# Patient Record
Sex: Male | Born: 1945
Health system: Southern US, Community
[De-identification: ages and names within clinical notes are randomized; demographics above are authoritative.]

## PROBLEM LIST (undated history)

## (undated) DIAGNOSIS — E039 Hypothyroidism, unspecified: Secondary | ICD-10-CM

## (undated) DIAGNOSIS — N400 Enlarged prostate without lower urinary tract symptoms: Secondary | ICD-10-CM

## (undated) DIAGNOSIS — E785 Hyperlipidemia, unspecified: Secondary | ICD-10-CM

## (undated) HISTORY — DX: Hyperlipidemia, unspecified: E78.5

## (undated) HISTORY — DX: Benign prostatic hyperplasia without lower urinary tract symptoms: N40.0

## (undated) HISTORY — PX: FEMUR FRACTURE SURGERY: SHX633

## (undated) HISTORY — DX: Hypothyroidism, unspecified: E03.9

---

## 2004-12-22 ENCOUNTER — Ambulatory Visit (HOSPITAL_COMMUNITY): Admission: RE | Admit: 2004-12-22 | Discharge: 2004-12-22 | Payer: Self-pay | Admitting: Gastroenterology

## 2008-04-12 ENCOUNTER — Inpatient Hospital Stay (HOSPITAL_COMMUNITY): Admission: EM | Admit: 2008-04-12 | Discharge: 2008-04-16 | Payer: Self-pay
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
## 2011-03-25 NOTE — Op Note (Signed)
Jeffrey Suarez, Jeffrey Suarez               ACCOUNT NO.:  [id number]   MEDICAL RECORD NO.:  [id number]          PATIENT TYPE:  AMB   LOCATION:  ENDO                         FACILITY:  [facility]   PHYSICIAN:  Anselmo Rod, M.D.  DATE OF BIRTH:  Apr 29, 1946   DATE OF PROCEDURE:  12/22/2004  DATE OF DISCHARGE:                                 OPERATIVE REPORT   PROCEDURE PERFORMED:  Screening colonoscopy.   ENDOSCOPIST:  Charna Elizabeth, M.D.   INSTRUMENT USED:  Olympus video colonoscope.   INDICATIONS FOR PROCEDURE:  A 65 year old white male undergoing screening  colonoscopy to rule out colonic polyps, masses, etc.   PREPROCEDURE PREPARATION:  Informed consent was procured from the patient.  The patient was fasted for eight hours prior to the procedure and prepped  with a bottle of magnesium citrate and a gallon of GoLYTELY the night prior  to the procedure.  The risks and benefits of the procedure including a 10%  miss rate for colon polyps or cancers was discussed with the patient as  well.   PREPROCEDURE PHYSICAL:  The patient had stable vital signs.  Neck supple.  Chest clear to auscultation.  S1 and S2 regular.  Abdomen soft with normal  bowel sounds.   DESCRIPTION OF PROCEDURE:  The patient was placed in left lateral decubitus  position and sedated with 70 mg of Demerol and 7.5 mg of Versed in slow  incremental doses.  Once the patient was adequately sedated and maintained  on low flow oxygen and continuous cardiac monitoring, the Olympus video  colonoscope was advanced from the rectum to the cecum.  The appendicular  orifice and ileocecal valve were clearly visualized and photographed.  There  was some residual stool in the colon and multiple washes were done.  No  masses, polyps, erosions, ulcerations or diverticula were seen.  Small  internal hemorrhoids were seen on retroflexion in the rectum.  The patient  tolerated the procedure well without complication.   IMPRESSION:  1.  Normal  colonoscopy up to the cecum except for small internal      hemorrhoids.  2.  Some stool in the right side of the colon.  Multiple washes done.   RECOMMENDATIONS:  1.  Continue high fiber diet with liberal fluid intake.  2.  Repeat colonoscopy is recommended in the next five years unless the      patient develops any abnormal symptoms in the interim.  3.  Outpatient followup as need arises in the future.      JNM/MEDQ  D:  12/23/2004  T:  12/23/2004  Job:  [id number]

## 2011-08-04 LAB — BASIC METABOLIC PANEL
BUN: 15
BUN: 18
BUN: 9
CO2: 23
Calcium: 8 — ABNORMAL LOW
Chloride: 104
Chloride: 104
Creatinine, Ser: 1.25
GFR calc Af Amer: >60
GFR calc non Af Amer: 58 — ABNORMAL LOW
GFR calc non Af Amer: 59 — ABNORMAL LOW
GFR calc non Af Amer: 60 — ABNORMAL LOW
Glucose, Bld: 116 — ABNORMAL HIGH
Glucose, Bld: 120 — ABNORMAL HIGH
Glucose, Bld: 146 — ABNORMAL HIGH
Potassium: 3.7
Potassium: 3.9
Potassium: 4.1
Sodium: 132 — ABNORMAL LOW
Sodium: 139

## 2011-08-04 LAB — CBC
HCT: 22.2 — ABNORMAL LOW
HCT: 26.4 — ABNORMAL LOW
HCT: 27 — ABNORMAL LOW
HCT: 27.5 — ABNORMAL LOW
HCT: 37.5 — ABNORMAL LOW
Hemoglobin: 9.2 — ABNORMAL LOW
Hemoglobin: 9.4 — ABNORMAL LOW
MCHC: 35.1
MCV: 91.1
MCV: 91.2
MCV: 91.6
MCV: 91.8
Platelets: 137 — ABNORMAL LOW
Platelets: 138 — ABNORMAL LOW
Platelets: 174
Platelets: 188
RBC: 2.94 — ABNORMAL LOW
RBC: 4.11 — ABNORMAL LOW
RDW: 12.9
RDW: 13.2
RDW: 13.3
WBC: 10.9 — ABNORMAL HIGH
WBC: 8
WBC: 8.7

## 2011-08-04 LAB — TYPE AND SCREEN: ABO/RH(D): A NEG

## 2011-08-04 LAB — POCT I-STAT, CHEM 8
BUN: 25 — ABNORMAL HIGH
Chloride: 107
Creatinine, Ser: 1.5
Glucose, Bld: 134 — ABNORMAL HIGH
Potassium: 3.9

## 2011-08-04 LAB — PREPARE RBC (CROSSMATCH)

## 2014-12-17 DIAGNOSIS — Z85828 Personal history of other malignant neoplasm of skin: Secondary | ICD-10-CM | POA: Diagnosis not present

## 2014-12-17 DIAGNOSIS — Z08 Encounter for follow-up examination after completed treatment for malignant neoplasm: Secondary | ICD-10-CM | POA: Diagnosis not present

## 2014-12-17 DIAGNOSIS — L821 Other seborrheic keratosis: Secondary | ICD-10-CM | POA: Diagnosis not present

## 2015-07-22 DIAGNOSIS — Z85828 Personal history of other malignant neoplasm of skin: Secondary | ICD-10-CM | POA: Diagnosis not present

## 2015-07-22 DIAGNOSIS — Z08 Encounter for follow-up examination after completed treatment for malignant neoplasm: Secondary | ICD-10-CM | POA: Diagnosis not present

## 2015-07-22 DIAGNOSIS — L821 Other seborrheic keratosis: Secondary | ICD-10-CM | POA: Diagnosis not present

## 2015-07-22 DIAGNOSIS — L570 Actinic keratosis: Secondary | ICD-10-CM | POA: Diagnosis not present

## 2015-08-21 DIAGNOSIS — Z23 Encounter for immunization: Secondary | ICD-10-CM | POA: Diagnosis not present

## 2015-09-18 DIAGNOSIS — Z23 Encounter for immunization: Secondary | ICD-10-CM | POA: Diagnosis not present

## 2015-11-16 DIAGNOSIS — E039 Hypothyroidism, unspecified: Secondary | ICD-10-CM | POA: Diagnosis not present

## 2015-11-16 DIAGNOSIS — N401 Enlarged prostate with lower urinary tract symptoms: Secondary | ICD-10-CM | POA: Diagnosis not present

## 2015-11-16 DIAGNOSIS — N138 Other obstructive and reflux uropathy: Secondary | ICD-10-CM | POA: Diagnosis not present

## 2015-11-25 DIAGNOSIS — Z85828 Personal history of other malignant neoplasm of skin: Secondary | ICD-10-CM | POA: Diagnosis not present

## 2015-11-25 DIAGNOSIS — D1801 Hemangioma of skin and subcutaneous tissue: Secondary | ICD-10-CM | POA: Diagnosis not present

## 2015-11-25 DIAGNOSIS — Z08 Encounter for follow-up examination after completed treatment for malignant neoplasm: Secondary | ICD-10-CM | POA: Diagnosis not present

## 2016-05-25 DIAGNOSIS — Z08 Encounter for follow-up examination after completed treatment for malignant neoplasm: Secondary | ICD-10-CM | POA: Diagnosis not present

## 2016-05-25 DIAGNOSIS — B079 Viral wart, unspecified: Secondary | ICD-10-CM | POA: Diagnosis not present

## 2016-05-25 DIAGNOSIS — C44612 Basal cell carcinoma of skin of right upper limb, including shoulder: Secondary | ICD-10-CM | POA: Diagnosis not present

## 2016-05-25 DIAGNOSIS — Z85828 Personal history of other malignant neoplasm of skin: Secondary | ICD-10-CM | POA: Diagnosis not present

## 2016-05-25 DIAGNOSIS — L821 Other seborrheic keratosis: Secondary | ICD-10-CM | POA: Diagnosis not present

## 2016-06-13 DIAGNOSIS — L821 Other seborrheic keratosis: Secondary | ICD-10-CM | POA: Diagnosis not present

## 2016-08-17 DIAGNOSIS — Z23 Encounter for immunization: Secondary | ICD-10-CM | POA: Diagnosis not present

## 2016-09-22 DIAGNOSIS — M129 Arthropathy, unspecified: Secondary | ICD-10-CM | POA: Diagnosis not present

## 2016-09-22 DIAGNOSIS — M79675 Pain in left toe(s): Secondary | ICD-10-CM | POA: Diagnosis not present

## 2016-11-11 DIAGNOSIS — E781 Pure hyperglyceridemia: Secondary | ICD-10-CM | POA: Diagnosis not present

## 2016-11-11 DIAGNOSIS — E039 Hypothyroidism, unspecified: Secondary | ICD-10-CM | POA: Diagnosis not present

## 2016-11-14 DIAGNOSIS — N401 Enlarged prostate with lower urinary tract symptoms: Secondary | ICD-10-CM | POA: Diagnosis not present

## 2016-11-14 DIAGNOSIS — N138 Other obstructive and reflux uropathy: Secondary | ICD-10-CM | POA: Diagnosis not present

## 2016-11-14 DIAGNOSIS — N289 Disorder of kidney and ureter, unspecified: Secondary | ICD-10-CM | POA: Diagnosis not present

## 2016-11-14 DIAGNOSIS — R351 Nocturia: Secondary | ICD-10-CM | POA: Diagnosis not present

## 2016-11-14 DIAGNOSIS — E039 Hypothyroidism, unspecified: Secondary | ICD-10-CM | POA: Diagnosis not present

## 2016-11-28 DIAGNOSIS — Z85828 Personal history of other malignant neoplasm of skin: Secondary | ICD-10-CM | POA: Diagnosis not present

## 2016-11-28 DIAGNOSIS — L821 Other seborrheic keratosis: Secondary | ICD-10-CM | POA: Diagnosis not present

## 2016-11-28 DIAGNOSIS — Z08 Encounter for follow-up examination after completed treatment for malignant neoplasm: Secondary | ICD-10-CM | POA: Diagnosis not present

## 2017-02-15 DIAGNOSIS — R351 Nocturia: Secondary | ICD-10-CM | POA: Diagnosis not present

## 2017-02-15 DIAGNOSIS — E039 Hypothyroidism, unspecified: Secondary | ICD-10-CM | POA: Diagnosis not present

## 2017-02-15 DIAGNOSIS — Z87448 Personal history of other diseases of urinary system: Secondary | ICD-10-CM | POA: Diagnosis not present

## 2017-03-20 DIAGNOSIS — H6592 Unspecified nonsuppurative otitis media, left ear: Secondary | ICD-10-CM | POA: Diagnosis not present

## 2017-03-20 DIAGNOSIS — H6123 Impacted cerumen, bilateral: Secondary | ICD-10-CM | POA: Diagnosis not present

## 2017-05-17 DIAGNOSIS — E039 Hypothyroidism, unspecified: Secondary | ICD-10-CM | POA: Diagnosis not present

## 2017-05-17 DIAGNOSIS — Z125 Encounter for screening for malignant neoplasm of prostate: Secondary | ICD-10-CM | POA: Diagnosis not present

## 2017-05-17 DIAGNOSIS — R351 Nocturia: Secondary | ICD-10-CM | POA: Diagnosis not present

## 2017-05-17 DIAGNOSIS — Z87448 Personal history of other diseases of urinary system: Secondary | ICD-10-CM | POA: Diagnosis not present

## 2017-05-17 DIAGNOSIS — N401 Enlarged prostate with lower urinary tract symptoms: Secondary | ICD-10-CM | POA: Diagnosis not present

## 2017-05-17 DIAGNOSIS — E782 Mixed hyperlipidemia: Secondary | ICD-10-CM | POA: Diagnosis not present

## 2017-05-17 DIAGNOSIS — Z0000 Encounter for general adult medical examination without abnormal findings: Secondary | ICD-10-CM | POA: Diagnosis not present

## 2017-07-12 DIAGNOSIS — L821 Other seborrheic keratosis: Secondary | ICD-10-CM | POA: Diagnosis not present

## 2017-07-12 DIAGNOSIS — Z08 Encounter for follow-up examination after completed treatment for malignant neoplasm: Secondary | ICD-10-CM | POA: Diagnosis not present

## 2017-07-12 DIAGNOSIS — L570 Actinic keratosis: Secondary | ICD-10-CM | POA: Diagnosis not present

## 2017-07-12 DIAGNOSIS — Z85828 Personal history of other malignant neoplasm of skin: Secondary | ICD-10-CM | POA: Diagnosis not present

## 2017-08-21 DIAGNOSIS — Z23 Encounter for immunization: Secondary | ICD-10-CM | POA: Diagnosis not present

## 2018-01-02 DIAGNOSIS — R6889 Other general symptoms and signs: Secondary | ICD-10-CM | POA: Diagnosis not present

## 2018-01-29 DIAGNOSIS — M6740 Ganglion, unspecified site: Secondary | ICD-10-CM | POA: Diagnosis not present

## 2018-01-29 DIAGNOSIS — H1033 Unspecified acute conjunctivitis, bilateral: Secondary | ICD-10-CM | POA: Diagnosis not present

## 2018-01-29 DIAGNOSIS — E782 Mixed hyperlipidemia: Secondary | ICD-10-CM | POA: Diagnosis not present

## 2018-03-02 DIAGNOSIS — M13841 Other specified arthritis, right hand: Secondary | ICD-10-CM | POA: Diagnosis not present

## 2018-03-02 DIAGNOSIS — M13842 Other specified arthritis, left hand: Secondary | ICD-10-CM | POA: Diagnosis not present

## 2018-04-16 ENCOUNTER — Other Ambulatory Visit: Payer: Self-pay | Admitting: Family Medicine

## 2018-04-16 ENCOUNTER — Ambulatory Visit (HOSPITAL_BASED_OUTPATIENT_CLINIC_OR_DEPARTMENT_OTHER)
Admission: RE | Admit: 2018-04-16 | Discharge: 2018-04-16 | Disposition: A | Discharge status: Home / Self Care | Payer: Medicare Other | Source: Ambulatory Visit | Attending: Family Medicine | Admitting: Family Medicine

## 2018-04-16 DIAGNOSIS — M79662 Pain in left lower leg: Secondary | ICD-10-CM | POA: Diagnosis not present

## 2018-04-16 DIAGNOSIS — M7989 Other specified soft tissue disorders: Secondary | ICD-10-CM | POA: Insufficient documentation

## 2018-04-16 DIAGNOSIS — R0989 Other specified symptoms and signs involving the circulatory and respiratory systems: Secondary | ICD-10-CM | POA: Diagnosis not present

## 2018-04-16 DIAGNOSIS — J22 Unspecified acute lower respiratory infection: Secondary | ICD-10-CM | POA: Diagnosis not present

## 2018-04-16 DIAGNOSIS — M79605 Pain in left leg: Secondary | ICD-10-CM | POA: Diagnosis not present

## 2018-04-16 DIAGNOSIS — R0602 Shortness of breath: Secondary | ICD-10-CM | POA: Diagnosis not present

## 2018-04-16 DIAGNOSIS — R05 Cough: Secondary | ICD-10-CM | POA: Diagnosis not present

## 2018-04-23 DIAGNOSIS — L570 Actinic keratosis: Secondary | ICD-10-CM | POA: Diagnosis not present

## 2018-04-23 DIAGNOSIS — Z85828 Personal history of other malignant neoplasm of skin: Secondary | ICD-10-CM | POA: Diagnosis not present

## 2018-04-23 DIAGNOSIS — L821 Other seborrheic keratosis: Secondary | ICD-10-CM | POA: Diagnosis not present

## 2018-06-25 DIAGNOSIS — Z1211 Encounter for screening for malignant neoplasm of colon: Secondary | ICD-10-CM | POA: Diagnosis not present

## 2018-06-25 DIAGNOSIS — N401 Enlarged prostate with lower urinary tract symptoms: Secondary | ICD-10-CM | POA: Diagnosis not present

## 2018-06-25 DIAGNOSIS — E039 Hypothyroidism, unspecified: Secondary | ICD-10-CM | POA: Diagnosis not present

## 2018-06-25 DIAGNOSIS — Z0000 Encounter for general adult medical examination without abnormal findings: Secondary | ICD-10-CM | POA: Diagnosis not present

## 2018-06-25 DIAGNOSIS — H6122 Impacted cerumen, left ear: Secondary | ICD-10-CM | POA: Diagnosis not present

## 2018-06-25 DIAGNOSIS — E782 Mixed hyperlipidemia: Secondary | ICD-10-CM | POA: Diagnosis not present

## 2018-07-02 DIAGNOSIS — Z0000 Encounter for general adult medical examination without abnormal findings: Secondary | ICD-10-CM | POA: Diagnosis not present

## 2018-07-02 DIAGNOSIS — E782 Mixed hyperlipidemia: Secondary | ICD-10-CM | POA: Diagnosis not present

## 2018-07-02 DIAGNOSIS — E039 Hypothyroidism, unspecified: Secondary | ICD-10-CM | POA: Diagnosis not present

## 2018-07-13 DIAGNOSIS — R0609 Other forms of dyspnea: Secondary | ICD-10-CM | POA: Diagnosis not present

## 2018-07-13 DIAGNOSIS — E782 Mixed hyperlipidemia: Secondary | ICD-10-CM | POA: Diagnosis not present

## 2018-07-13 DIAGNOSIS — R6889 Other general symptoms and signs: Secondary | ICD-10-CM | POA: Diagnosis not present

## 2018-07-27 DIAGNOSIS — R6889 Other general symptoms and signs: Secondary | ICD-10-CM | POA: Diagnosis not present

## 2018-07-27 DIAGNOSIS — R0602 Shortness of breath: Secondary | ICD-10-CM | POA: Diagnosis not present

## 2018-08-15 DIAGNOSIS — R0609 Other forms of dyspnea: Secondary | ICD-10-CM | POA: Diagnosis not present

## 2018-08-27 DIAGNOSIS — R0609 Other forms of dyspnea: Secondary | ICD-10-CM | POA: Diagnosis not present

## 2018-08-27 DIAGNOSIS — R6889 Other general symptoms and signs: Secondary | ICD-10-CM | POA: Diagnosis not present

## 2018-08-27 DIAGNOSIS — E782 Mixed hyperlipidemia: Secondary | ICD-10-CM | POA: Diagnosis not present

## 2018-08-28 DIAGNOSIS — Z23 Encounter for immunization: Secondary | ICD-10-CM | POA: Diagnosis not present

## 2018-09-28 ENCOUNTER — Ambulatory Visit (INDEPENDENT_AMBULATORY_CARE_PROVIDER_SITE_OTHER): Payer: Medicare Other | Admitting: Pulmonary Disease

## 2018-09-28 ENCOUNTER — Encounter: Payer: Self-pay | Admitting: Pulmonary Disease

## 2018-09-28 VITALS — BP 118/80 | HR 92 | Ht 67.5 in | Wt 169.6 lb

## 2018-09-28 DIAGNOSIS — I340 Nonrheumatic mitral (valve) insufficiency: Secondary | ICD-10-CM | POA: Diagnosis not present

## 2018-09-28 DIAGNOSIS — R0609 Other forms of dyspnea: Secondary | ICD-10-CM | POA: Diagnosis not present

## 2018-09-28 DIAGNOSIS — I272 Pulmonary hypertension, unspecified: Secondary | ICD-10-CM | POA: Diagnosis not present

## 2018-09-28 DIAGNOSIS — I071 Rheumatic tricuspid insufficiency: Secondary | ICD-10-CM | POA: Diagnosis not present

## 2018-09-28 NOTE — Patient Instructions (Addendum)
Thank you for visiting Dr. Valeta Harms at [facility]. Today we recommend the following: Orders Placed This Encounter  Procedures  . Pulmonary function test   Please schedule PFTs next week. I we will call with results.   Return in about 8 weeks (around 11/23/2018).

## 2018-09-28 NOTE — Progress Notes (Signed)
Synopsis: Referred in November 2019 for SOB/DOE by Glenford Bayley, DO  Subjective:   PATIENT ID: Jeffrey Suarez GENDER: male DOB: 1946/06/25, MRN: [id number]  Chief Complaint  Patient presents with  . Consult    States he does alot of cycling with age group 13-71, over the summer it got really hot and they continued to ride, states he overheats and looses his breath. He siad it sometimes takes him 2 days to recover. Denies chest pain and chest tightness. Has seen cardiologist.    PMH of BPH, hypothyroidism. Over the past several months he has noticed increasing DOE on exertion. He has noticed worsening with significant heat exposure. This SOB has been present for many years. He noticed this even as a child and growing up exercising and play sports. He does feel like he looses a lot of fluid while riding his bike. He is currently an avid biker. Riding his bicycle up to 40+ miles at a time. He rides up to two times per week.   The patient was seen by [facility] cardiology.  He had a stress test which was read as an intermediate risk study due to a low LVEF calculated at 38% on the gated SPECT imaging.  Echocardiogram however completed following this on 08/15/2018 was read as a normal size, mild concentric hypertrophy and a calculated EF 62%.  He does have mild MR and TR.  His estimated pulmonary systolic pressure was 32.  He did have a dilated IVC on echo with elevated RA pressures.  He does not have any chest tightness or wheezing with exertion.  To his knowledge he has never had any wheezing respiratory symptoms before.  He does not have cough.  He tries to maintain hydrated during his exercise and cycling.  If it is extremely hot day meaning above 73 F he refuses to ride on these days because of worsening symptomatology.   Past Medical History:  Diagnosis Date  . BPH (benign prostatic hyperplasia)   . HLD (hyperlipidemia)   . Hypothyroidism      Family History  Problem Relation Age of  Onset  . Lung cancer Mother   . Breast cancer Mother   . Heart disease Father      Past Surgical History:  Procedure Laterality Date  . FEMUR FRACTURE SURGERY      Social History   Socioeconomic History  . Marital status: Married    Spouse name: Not on file  . Number of children: Not on file  . Years of education: Not on file  . Highest education level: Not on file  Occupational History  . Not on file  Social Needs  . Financial resource strain: Not on file  . Food insecurity:    Worry: Not on file    Inability: Not on file  . Transportation needs:    Medical: Not on file    Non-medical: Not on file  Tobacco Use  . Smoking status: Never Smoker  . Smokeless tobacco: Never Used  Substance and Sexual Activity  . Alcohol use: Yes    Comment: occasionally   . Drug use: Never  . Sexual activity: Not on file  Lifestyle  . Physical activity:    Days per week: Not on file    Minutes per session: Not on file  . Stress: Not on file  Relationships  . Social connections:    Talks on phone: Not on file    Gets together: Not on file  Attends religious service: Not on file    Active member of club or organization: Not on file    Attends meetings of clubs or organizations: Not on file    Relationship status: Not on file  . Intimate partner violence:    Fear of current or ex partner: Not on file    Emotionally abused: Not on file    Physically abused: Not on file    Forced sexual activity: Not on file  Other Topics Concern  . Not on file  Social History Narrative  . Not on file     Not on File   Outpatient Medications Prior to Visit  Medication Sig Dispense Refill  . levothyroxine (SYNTHROID, LEVOTHROID) 137 MCG tablet Take 137 mcg by mouth daily before breakfast.    . rosuvastatin (CRESTOR) 5 MG tablet Take 5 mg by mouth daily.     No facility-administered medications prior to visit.     Review of Systems  Constitutional: Negative for chills, fever,  malaise/fatigue and weight loss.  HENT: Negative for hearing loss, sore throat and tinnitus.   Eyes: Negative for blurred vision and double vision.  Respiratory: Positive for shortness of breath. Negative for cough, hemoptysis, sputum production, wheezing and stridor.        Only with significant exertion  Cardiovascular: Negative for chest pain, palpitations, orthopnea, leg swelling and PND.  Gastrointestinal: Negative for abdominal pain, constipation, diarrhea, heartburn, nausea and vomiting.  Genitourinary: Negative for dysuria, hematuria and urgency.  Musculoskeletal: Negative for joint pain and myalgias.  Skin: Negative for itching and rash.  Neurological: Negative for dizziness, tingling, weakness and headaches.  Endo/Heme/Allergies: Negative for environmental allergies. Does not bruise/bleed easily.  Psychiatric/Behavioral: Negative for depression. The patient is not nervous/anxious and does not have insomnia.   All other systems reviewed and are negative.    Objective:  Physical Exam  Constitutional: He is oriented to person, place, and time. He appears well-developed and well-nourished. No distress.  HENT:  Head: Normocephalic and atraumatic.  Mouth/Throat: Oropharynx is clear and moist.  Eyes: Pupils are equal, round, and reactive to light. Conjunctivae are normal. No scleral icterus.  Neck: Neck supple. No JVD present. No tracheal deviation present.  Cardiovascular: Normal rate, regular rhythm and intact distal pulses.  Murmur (Faint systolic in the left lateral.) heard. Normal S1-S2  Pulmonary/Chest: Effort normal and breath sounds normal. No accessory muscle usage or stridor. No tachypnea. No respiratory distress. He has no wheezes. He has no rhonchi. He has no rales.  Abdominal: Soft. Bowel sounds are normal. He exhibits no distension. There is no tenderness.  Musculoskeletal: He exhibits no edema or tenderness.  Lymphadenopathy:    He has no cervical adenopathy.    Neurological: He is alert and oriented to person, place, and time.  Skin: Skin is warm and dry. Capillary refill takes less than 2 seconds. No rash noted.  Psychiatric: He has a normal mood and affect. His behavior is normal.  Vitals reviewed.    Vitals:   09/28/18 1030  BP: 118/80  Pulse: 92  SpO2: 99%  Weight: 169 lb 9.6 oz (76.9 kg)  Height: 5' 7.5" (1.715 m)   99% on RA BMI Readings from Last 3 Encounters:  09/28/18 26.17 kg/m   Wt Readings from Last 3 Encounters:  09/28/18 169 lb 9.6 oz (76.9 kg)     CBC    Component Value Date/Time   WBC 8.7 04/16/2008 0530   RBC 2.94 (L) 04/16/2008 0530   HGB 9.4 (  L) 04/16/2008 0530   HCT 27.0 (L) 04/16/2008 0530   PLT 188 04/16/2008 0530   MCV 91.6 04/16/2008 0530   MCHC 34.7 04/16/2008 0530   RDW 13.4 04/16/2008 0530    Chest Imaging: Patient states that he had chest imaging completed back in the summer when he was diagnosed with bronchitis at [facility].  I am not able to review this imaging at this time.  Pulmonary Functions Testing Results: None  FeNO: None   Pathology: None   Echocardiogram: 08/15/2018: None left ventricular cavity is normal.  Mild concentric hypertrophy of the left ventricle.  Normal global wall motion.  Calculated EF 62%.  Trace aortic regurg.  Mild to moderate mitral regurgitation.  Mild tricuspid regurgitation.  Borderline pulmonary hypertension with approximate PA systolic pressure of 32 mmHg.  IVC dilated with a respiratory response of less than 50% consistent with an elevated RA pressure.  Heart Catheterization: None    Assessment & Plan:   DOE (dyspnea on exertion) - Plan: Pulmonary function test  Possible Pulmonary hypertension, elevated pressures on echocardiography  - TTE: 08/15/2018  Moderate mitral regurgitation by prior echocardiogram  Mild tricuspid regurgitation by prior echocardiogram  Discussion:  This is a 72 year old gentleman, BMI 26, currently avid  cyclist approximately 2 trips greater than 40+ miles twice weekly.  During significant heat in summer months develops worsening dyspnea on exertion and unable to maintain his usual pace and rhythm while riding.  He has had cardiac evaluation with a normal nuclear stress test.  There was discussion about completing a CTA to evaluate his coronaries.  Cardiology recommended pulmonary evaluation.  He is a lifelong non-smoker.  No prior family history of lung diseases.  No history of rheumatologic disease.  He does have significant sweat and volume loss during these exercising events.  When he describes his event closely he also complains of an altered sensorium and mental status during the time where he feels a little bit confused or blurred.  With evidence on his echocardiogram of possible pulmonary hypertension and an elevated RA pressure one would consider a worsening of pH with exercise-induced pulmonary hypertension while riding his bicycle and symptoms could become worse with volume depletion.  However I think this would be difficult to prove.  Also he does not have baseline symptoms of exertion or edema consistent with PH.  I think the next best step would be full PFTs with evaluation of lung function, spirometry, lung volumes and DLCO.  If the DLCO is low lung volumes would be abnormal would proceed with chest imaging.  If this was normal would consider right heart catheterization for confirmation of pulmonary hypertension  Greater than 50% of this patient's 60-minute office visit was spent face-to-face discussing the above diagnostic approach.   Current Outpatient Medications:  .  levothyroxine (SYNTHROID, LEVOTHROID) 137 MCG tablet, Take 137 mcg by mouth daily before breakfast., Disp: , Rfl:  .  rosuvastatin (CRESTOR) 5 MG tablet, Take 5 mg by mouth daily., Disp: , Rfl:    Garner Nash, DO [place] [facility] 09/28/2018 11:17 AM

## 2018-10-03 ENCOUNTER — Ambulatory Visit (INDEPENDENT_AMBULATORY_CARE_PROVIDER_SITE_OTHER): Payer: Medicare Other | Admitting: Pulmonary Disease

## 2018-10-03 DIAGNOSIS — R0609 Other forms of dyspnea: Secondary | ICD-10-CM

## 2018-10-03 LAB — PULMONARY FUNCTION TEST
DL/VA % PRED: 69 %
DL/VA: 3.15 ml/min/mmHg/L
DLCO unc % pred: 66 %
DLCO unc: 20.74 ml/min/mmHg
FEF 25-75 POST: 2.64 L/s
FEF 25-75 Pre: 1.75 L/sec
FEF2575-%CHANGE-POST: 51 %
FEF2575-%PRED-PRE: 74 %
FEF2575-%Pred-Post: 113 %
FEV1-%CHANGE-POST: 12 %
FEV1-%PRED-PRE: 94 %
FEV1-%Pred-Post: 106 %
FEV1-PRE: 2.93 L
FEV1-Post: 3.28 L
FEV1FVC-%Change-Post: 6 %
FEV1FVC-%PRED-PRE: 92 %
FEV6-%Change-Post: 5 %
FEV6-%PRED-PRE: 106 %
FEV6-%Pred-Post: 112 %
FEV6-POST: 4.48 L
FEV6-Pre: 4.23 L
FEV6FVC-%Change-Post: 0 %
FEV6FVC-%PRED-POST: 104 %
FEV6FVC-%Pred-Pre: 104 %
FVC-%CHANGE-POST: 5 %
FVC-%PRED-POST: 107 %
FVC-%PRED-PRE: 102 %
FVC-POST: 4.55 L
FVC-PRE: 4.31 L
PRE FEV1/FVC RATIO: 68 %
Post FEV1/FVC ratio: 72 %
Post FEV6/FVC ratio: 99 %
Pre FEV6/FVC Ratio: 98 %
RV % pred: 45 %
RV: 1.1 L
TLC % PRED: 77 %
TLC: 5.31 L

## 2018-10-03 NOTE — Progress Notes (Signed)
PFT done today. 

## 2018-10-19 ENCOUNTER — Telehealth: Payer: Self-pay | Admitting: Pulmonary Disease

## 2018-10-19 DIAGNOSIS — R0602 Shortness of breath: Secondary | ICD-10-CM

## 2018-10-19 DIAGNOSIS — R942 Abnormal results of pulmonary function studies: Secondary | ICD-10-CM

## 2018-10-19 NOTE — Telephone Encounter (Signed)
Dr. Valeta Harms, please advise, patient is requesting the results from his PFT test. Thank you.

## 2018-10-22 NOTE — Telephone Encounter (Signed)
PCCM:  He has a mildly reduced DLCO. As discussed in the office last time he was here pending any abnormalities of his PFTs would consider HRCT imaging of the chest. He has not had a CXR recently in our system and his insurance my require CXR prior to HRCT. However, we can discuss this at his next visit. But if patient is willing would like to proceed with HRCT imaging of the chest. (dx DOE, decreased diffusion capacity)  Thanks  Garner Nash, DO [place] [facility] 10/22/2018 8:47 AM

## 2018-10-22 NOTE — Telephone Encounter (Signed)
Called and spoke with pt letting him know the results of the PFT and based on there being some mild decrease in diffusion capacity, stated to pt that BI would like to go ahead and proceed with HRCT if he was fine with it.  Pt stated that he would proceed with the HRCT but wanted to have it done right before his f/u with BI. I have placed order for the CT. Nothing further needed.

## 2018-11-14 ENCOUNTER — Ambulatory Visit (HOSPITAL_BASED_OUTPATIENT_CLINIC_OR_DEPARTMENT_OTHER)
Admission: RE | Admit: 2018-11-14 | Discharge: 2018-11-14 | Disposition: A | Discharge status: Home / Self Care | Payer: Medicare Other | Source: Ambulatory Visit | Attending: Pulmonary Disease | Admitting: Pulmonary Disease

## 2018-11-14 DIAGNOSIS — R0602 Shortness of breath: Secondary | ICD-10-CM | POA: Diagnosis not present

## 2018-11-14 DIAGNOSIS — R942 Abnormal results of pulmonary function studies: Secondary | ICD-10-CM | POA: Insufficient documentation

## 2018-11-14 DIAGNOSIS — R918 Other nonspecific abnormal finding of lung field: Secondary | ICD-10-CM | POA: Diagnosis not present

## 2018-11-23 ENCOUNTER — Ambulatory Visit (INDEPENDENT_AMBULATORY_CARE_PROVIDER_SITE_OTHER): Payer: Medicare Other | Admitting: Pulmonary Disease

## 2018-11-23 ENCOUNTER — Encounter: Payer: Self-pay | Admitting: Pulmonary Disease

## 2018-11-23 VITALS — BP 106/68 | HR 82 | Ht 67.0 in | Wt 170.0 lb

## 2018-11-23 DIAGNOSIS — R942 Abnormal results of pulmonary function studies: Secondary | ICD-10-CM | POA: Diagnosis not present

## 2018-11-23 DIAGNOSIS — R918 Other nonspecific abnormal finding of lung field: Secondary | ICD-10-CM

## 2018-11-23 DIAGNOSIS — R911 Solitary pulmonary nodule: Secondary | ICD-10-CM

## 2018-11-23 DIAGNOSIS — R0602 Shortness of breath: Secondary | ICD-10-CM | POA: Diagnosis not present

## 2018-11-23 NOTE — Progress Notes (Signed)
Synopsis: Referred in November 2019 for SOB/DOE by No ref. provider found  Subjective:   PATIENT ID: Jeffrey Suarez GENDER: male DOB: 06-23-1946, MRN: [id number]  Chief Complaint  Patient presents with  . Follow-up    F/U for DOE. Chest CT 01/08.     PMH of BPH, hypothyroidism. Over the past several months he has noticed increasing DOE on exertion. He has noticed worsening with significant heat exposure. This SOB has been present for many years. He noticed this even as a child and growing up exercising and play sports. He does feel like he looses a lot of fluid while riding his bike. He is currently an avid biker. Riding his bicycle up to 40+ miles at a time. He rides up to two times per week.   The patient was seen by [facility] cardiology.  He had a stress test which was read as an intermediate risk study due to a low LVEF calculated at 38% on the gated SPECT imaging.  Echocardiogram however completed following this on 08/15/2018 was read as a normal size, mild concentric hypertrophy and a calculated EF 62%.  He does have mild MR and TR.  His estimated pulmonary systolic pressure was 32.  He did have a dilated IVC on echo with elevated RA pressures.  He does not have any chest tightness or wheezing with exertion.  To his knowledge he has never had any wheezing respiratory symptoms before.  He does not have cough.  He tries to maintain hydrated during his exercise and cycling.  If it is extremely hot day meaning above 47 F he refuses to ride on these days because of worsening symptomatology.  OV 11/23/2018: Presents today for follow-up regarding high-resolution CT scan.  Overall his symptoms are may be slightly better since he was last seen by [place].  He still has been writing regularly.  He rides his bike approximately 120 miles a week.  He recently completed a very long distance bike ride several weeks ago at 100 miles.  He does well when he is on flat surfaces but still having some shortness  of breath when climbing hills and falls back behind his partners.  He had pulmonary function test which revealed normal spirometry and a mildly reduced DLCO of 66%.  Therefore we ended up having a high-resolution CT scan to rule out any evidence of ILD.  There was no evidence of ILD.  He did have a 3 mm lung nodule.  We reviewed his images today in the office.   Past Medical History:  Diagnosis Date  . BPH (benign prostatic hyperplasia)   . HLD (hyperlipidemia)   . Hypothyroidism      Family History  Problem Relation Age of Onset  . Lung cancer Mother   . Breast cancer Mother   . Heart disease Father      Past Surgical History:  Procedure Laterality Date  . FEMUR FRACTURE SURGERY      Social History   Socioeconomic History  . Marital status: Married    Spouse name: Not on file  . Number of children: Not on file  . Years of education: Not on file  . Highest education level: Not on file  Occupational History  . Not on file  Social Needs  . Financial resource strain: Not on file  . Food insecurity:    Worry: Not on file    Inability: Not on file  . Transportation needs:    Medical: Not on file  Non-medical: Not on file  Tobacco Use  . Smoking status: Never Smoker  . Smokeless tobacco: Never Used  Substance and Sexual Activity  . Alcohol use: Yes    Comment: occasionally   . Drug use: Never  . Sexual activity: Not on file  Lifestyle  . Physical activity:    Days per week: Not on file    Minutes per session: Not on file  . Stress: Not on file  Relationships  . Social connections:    Talks on phone: Not on file    Gets together: Not on file    Attends religious service: Not on file    Active member of club or organization: Not on file    Attends meetings of clubs or organizations: Not on file    Relationship status: Not on file  . Intimate partner violence:    Fear of current or ex partner: Not on file    Emotionally abused: Not on file    Physically  abused: Not on file    Forced sexual activity: Not on file  Other Topics Concern  . Not on file  Social History Narrative  . Not on file     Not on File   Outpatient Medications Prior to Visit  Medication Sig Dispense Refill  . levothyroxine (SYNTHROID, LEVOTHROID) 137 MCG tablet Take 137 mcg by mouth daily before breakfast.    . rosuvastatin (CRESTOR) 5 MG tablet Take 5 mg by mouth daily.     No facility-administered medications prior to visit.     Review of Systems  Constitutional: Negative for chills, fever, malaise/fatigue and weight loss.  HENT: Negative for hearing loss, sore throat and tinnitus.   Eyes: Negative for blurred vision and double vision.  Respiratory: Negative for cough, hemoptysis, sputum production, shortness of breath, wheezing and stridor.   Cardiovascular: Negative for chest pain, palpitations, orthopnea, leg swelling and PND.  Gastrointestinal: Negative for abdominal pain, constipation, diarrhea, heartburn, nausea and vomiting.  Genitourinary: Negative for dysuria, hematuria and urgency.  Musculoskeletal: Negative for joint pain and myalgias.  Skin: Negative for itching and rash.  Neurological: Negative for dizziness, tingling, weakness and headaches.  Endo/Heme/Allergies: Negative for environmental allergies. Does not bruise/bleed easily.  Psychiatric/Behavioral: Negative for depression. The patient is not nervous/anxious and does not have insomnia.   All other systems reviewed and are negative.    Objective:  Physical Exam Vitals signs reviewed.  Constitutional:      General: He is not in acute distress.    Appearance: He is well-developed.  HENT:     Head: Normocephalic and atraumatic.  Eyes:     General: No scleral icterus.    Conjunctiva/sclera: Conjunctivae normal.     Pupils: Pupils are equal, round, and reactive to light.  Neck:     Musculoskeletal: Neck supple.     Vascular: No JVD.     Trachea: No tracheal deviation.    Cardiovascular:     Rate and Rhythm: Normal rate and regular rhythm.     Heart sounds: Murmur present.  Pulmonary:     Effort: Pulmonary effort is normal. No tachypnea, accessory muscle usage or respiratory distress.     Breath sounds: Normal breath sounds. No stridor. No wheezing, rhonchi or rales.  Abdominal:     General: Bowel sounds are normal. There is no distension.     Palpations: Abdomen is soft.     Tenderness: There is no abdominal tenderness.  Musculoskeletal:        General:  No tenderness.  Lymphadenopathy:     Cervical: No cervical adenopathy.  Skin:    General: Skin is warm and dry.     Capillary Refill: Capillary refill takes less than 2 seconds.     Findings: No rash.  Neurological:     Mental Status: He is alert and oriented to person, place, and time.  Psychiatric:        Behavior: Behavior normal.      Vitals:   11/23/18 0908  BP: 106/68  Pulse: 82  SpO2: 100%  Weight: 170 lb (77.1 kg)  Height: 5\' 7"  (1.702 m)   100% on RA BMI Readings from Last 3 Encounters:  11/23/18 26.63 kg/m  09/28/18 26.17 kg/m   Wt Readings from Last 3 Encounters:  11/23/18 170 lb (77.1 kg)  09/28/18 169 lb 9.6 oz (76.9 kg)     CBC    Component Value Date/Time   WBC 8.7 04/16/2008 0530   RBC 2.94 (L) 04/16/2008 0530   HGB 9.4 (L) 04/16/2008 0530   HCT 27.0 (L) 04/16/2008 0530   PLT 188 04/16/2008 0530   MCV 91.6 04/16/2008 0530   MCHC 34.7 04/16/2008 0530   RDW 13.4 04/16/2008 0530    Chest Imaging: Patient states that he had chest imaging completed back in the summer when he was diagnosed with bronchitis at [facility] family [facility].  I am not able to review this imaging at this time.  11/14/2018: HRCT No evidence of ILD.  Small 3 mm lung nodule upper lobe right, The patient's images have been independently reviewed by me.    Pulmonary Functions Testing Results: None PFT Results Latest Ref Rng & Units 10/03/2018  FVC-Pre L 4.31  FVC-Predicted Pre % 102   FVC-Post L 4.55  FVC-Predicted Post % 107  Pre FEV1/FVC % % 68  Post FEV1/FCV % % 72  FEV1-Pre L 2.93  FEV1-Predicted Pre % 94  FEV1-Post L 3.28  DLCO UNC% % 66  DLCO COR %Predicted % 69  TLC L 5.31  TLC % Predicted % 77  RV % Predicted % 45    FeNO: None   Pathology: None   Echocardiogram: 08/15/2018: None left ventricular cavity is normal.  Mild concentric hypertrophy of the left ventricle.  Normal global wall motion.  Calculated EF 62%.  Trace aortic regurg.  Mild to moderate mitral regurgitation.  Mild tricuspid regurgitation.  Borderline pulmonary hypertension with approximate PA systolic pressure of 32 mmHg.  IVC dilated with a respiratory response of less than 50% consistent with an elevated RA pressure.  Heart Catheterization: None    Assessment & Plan:   Shortness of breath  Abnormal findings on diagnostic imaging of lung - Plan: CT Chest Wo Contrast  Lung nodule  Decreased diffusion capacity  Discussion:  73 year old gentleman, BMI 26 avid cyclist approximately 120 miles per week.  He has had a cardiac evaluation with normal nuclear stress imaging and CTA to evaluate his coronaries.  Cardiology referred for pulmonary evaluation.  Pulmonary function test have revealed normal spirometry with a mildly reduced DLCO of 66%.  HRCT of the chest with no evidence of ILD.  He did have a small upper lobe pulmonary nodule.  Today in the office we reviewed each of his images as well as his pulmonary function test that were completed.  Overall, his prior echo showed a peak pulmonary pressure of 32 mmHg.  He may in fact have some evidence of exercise-induced pulmonary hypertension.  However I think this is very difficult  to diagnose.  His DLCO is mildly reduced but he has no evidence of parenchymal lung disease on HRCT.  Therefore this would suggest a vascular reason for a reduction in DLCO.  I think further aggressive testing would require cardiac catheterization and even  exercise induced catheterization to view for elevations in PA pressures.  However with his minimal symptoms and his still excellent functional ability to ride his bicycle 120 miles a week I am not sure that more aggressive evaluation would change management at this time.  I think he should continue his current activities.  We will have a repeat noncontrasted CT of the chest to complete a follow-up in 1 year.  We discussed not doing this however the patient would like to follow-up.  Patient to return to clinic in 1 year after imaging. Greater than 50% of this patient's 25-minute office was spent face-to-face discussing above recommendations and treatment plan as well as reviewing imaging and PFTs.   Current Outpatient Medications:  .  levothyroxine (SYNTHROID, LEVOTHROID) 137 MCG tablet, Take 137 mcg by mouth daily before breakfast., Disp: , Rfl:  .  rosuvastatin (CRESTOR) 5 MG tablet, Take 5 mg by mouth daily., Disp: , Rfl:    Garner Nash, DO [place] [facility] 11/23/2018 9:25 AM

## 2018-11-23 NOTE — Patient Instructions (Addendum)
Thank you for visiting Dr. Valeta Harms at [facility]. Today we recommend the following: Orders Placed This Encounter  Procedures  . CT Chest Wo Contrast    Return in about 1 year (around 11/24/2019).

## 2018-12-13 ENCOUNTER — Telehealth: Payer: Self-pay | Admitting: Pulmonary Disease

## 2018-12-13 NOTE — Telephone Encounter (Signed)
lmtcb x1 for pt. 

## 2018-12-13 NOTE — Progress Notes (Signed)
Thank you for seeing him. I have seen the PFT's and your last office visit note. I have discussed with Dr. Virgina Jock, happy to do right and left heart cath on him. Will call patient to arrange follow up to further discuss.   Thanks,  Jeri Lager, FNP-C

## 2018-12-17 NOTE — Telephone Encounter (Signed)
Called and left message for Patient to call back. 

## 2018-12-18 NOTE — Telephone Encounter (Signed)
Called and spoke with patient, he stated that he did the breathing test and he wanted to know if this determined his Vo2 max, of if this would be a different test. BI please advise, thank you.

## 2018-12-18 NOTE — Telephone Encounter (Signed)
PCCM: This would need to come from a cardiopulmonary exercise test.  To my knowledge he has not had one of these. Unless his cardiologist ordered one but I doe not think so.  Garner Nash, DO [place] [facility] 12/18/2018 9:05 AM

## 2018-12-18 NOTE — Telephone Encounter (Signed)
Spoke with pt, aware of recs.  Nothing further needed at this time- will close encounter.   

## 2018-12-19 DIAGNOSIS — C44311 Basal cell carcinoma of skin of nose: Secondary | ICD-10-CM | POA: Diagnosis not present

## 2018-12-19 DIAGNOSIS — H61001 Unspecified perichondritis of right external ear: Secondary | ICD-10-CM | POA: Diagnosis not present

## 2018-12-19 DIAGNOSIS — Z85828 Personal history of other malignant neoplasm of skin: Secondary | ICD-10-CM | POA: Diagnosis not present

## 2018-12-19 DIAGNOSIS — D485 Neoplasm of uncertain behavior of skin: Secondary | ICD-10-CM | POA: Diagnosis not present

## 2018-12-19 DIAGNOSIS — Z08 Encounter for follow-up examination after completed treatment for malignant neoplasm: Secondary | ICD-10-CM | POA: Diagnosis not present

## 2018-12-21 DIAGNOSIS — H2513 Age-related nuclear cataract, bilateral: Secondary | ICD-10-CM | POA: Diagnosis not present

## 2018-12-21 DIAGNOSIS — H5203 Hypermetropia, bilateral: Secondary | ICD-10-CM | POA: Diagnosis not present

## 2018-12-21 DIAGNOSIS — Z135 Encounter for screening for eye and ear disorders: Secondary | ICD-10-CM | POA: Diagnosis not present

## 2019-03-26 DIAGNOSIS — C44311 Basal cell carcinoma of skin of nose: Secondary | ICD-10-CM | POA: Diagnosis not present

## 2019-04-01 ENCOUNTER — Other Ambulatory Visit: Payer: Self-pay | Admitting: Cardiology

## 2019-05-06 DIAGNOSIS — Z08 Encounter for follow-up examination after completed treatment for malignant neoplasm: Secondary | ICD-10-CM | POA: Diagnosis not present

## 2019-05-06 DIAGNOSIS — L570 Actinic keratosis: Secondary | ICD-10-CM | POA: Diagnosis not present

## 2019-05-06 DIAGNOSIS — Z85828 Personal history of other malignant neoplasm of skin: Secondary | ICD-10-CM | POA: Diagnosis not present

## 2019-05-06 DIAGNOSIS — L821 Other seborrheic keratosis: Secondary | ICD-10-CM | POA: Diagnosis not present

## 2019-07-01 DIAGNOSIS — H6123 Impacted cerumen, bilateral: Secondary | ICD-10-CM | POA: Diagnosis not present

## 2019-07-01 DIAGNOSIS — H9192 Unspecified hearing loss, left ear: Secondary | ICD-10-CM | POA: Diagnosis not present

## 2019-07-03 ENCOUNTER — Other Ambulatory Visit: Payer: Self-pay | Admitting: Cardiology

## 2019-07-10 DIAGNOSIS — Z23 Encounter for immunization: Secondary | ICD-10-CM | POA: Diagnosis not present

## 2019-08-14 DIAGNOSIS — M545 Low back pain: Secondary | ICD-10-CM | POA: Diagnosis not present

## 2019-08-16 ENCOUNTER — Other Ambulatory Visit (HOSPITAL_BASED_OUTPATIENT_CLINIC_OR_DEPARTMENT_OTHER): Payer: Self-pay | Admitting: Family Medicine

## 2019-08-16 DIAGNOSIS — G8929 Other chronic pain: Secondary | ICD-10-CM

## 2019-08-17 ENCOUNTER — Other Ambulatory Visit: Payer: Self-pay

## 2019-08-17 ENCOUNTER — Ambulatory Visit (HOSPITAL_BASED_OUTPATIENT_CLINIC_OR_DEPARTMENT_OTHER)
Admission: RE | Admit: 2019-08-17 | Discharge: 2019-08-17 | Disposition: A | Discharge status: Home / Self Care | Payer: Medicare Other | Source: Ambulatory Visit | Attending: Family Medicine | Admitting: Family Medicine

## 2019-08-17 DIAGNOSIS — M545 Low back pain: Secondary | ICD-10-CM | POA: Insufficient documentation

## 2019-08-17 DIAGNOSIS — G8929 Other chronic pain: Secondary | ICD-10-CM | POA: Insufficient documentation

## 2019-08-30 ENCOUNTER — Encounter: Payer: Self-pay | Admitting: Cardiology

## 2019-09-02 ENCOUNTER — Other Ambulatory Visit: Payer: Self-pay

## 2019-09-02 ENCOUNTER — Ambulatory Visit (INDEPENDENT_AMBULATORY_CARE_PROVIDER_SITE_OTHER): Payer: Medicare Other | Admitting: Cardiology

## 2019-09-02 ENCOUNTER — Encounter: Payer: Self-pay | Admitting: Cardiology

## 2019-09-02 VITALS — BP 128/74 | HR 69 | Temp 97.1°F | Ht 67.0 in | Wt 170.0 lb

## 2019-09-02 DIAGNOSIS — R0600 Dyspnea, unspecified: Secondary | ICD-10-CM

## 2019-09-02 DIAGNOSIS — I10 Essential (primary) hypertension: Secondary | ICD-10-CM | POA: Diagnosis not present

## 2019-09-02 DIAGNOSIS — E7800 Pure hypercholesterolemia, unspecified: Secondary | ICD-10-CM

## 2019-09-02 DIAGNOSIS — R0609 Other forms of dyspnea: Secondary | ICD-10-CM

## 2019-09-02 NOTE — Progress Notes (Signed)
Primary Physician/Referring:  [facility]  Patient ID: TEVION Suarez, male    DOB: [birth year], 73 y.o.   MRN: [id number]  Chief Complaint  Patient presents with  . Shortness of Breath  . Follow-up   HPI:    Jeffrey Suarez  is a 73 y.o.  Caucasian male with no significant comorbidities. He loves to live outdoors and is busy with cycling, camping, hiking. He is an avid cyclist and bikes up to 100 miles every week. Over the summer, he noted decreased exercise capacity with dyspnea on exertion particularly with going uphill. He noticed lagging behind his peers. He underwent echocardiogram and exercise nuclear stress test in September 2019 which revealed normal LVEF, mild pulmonary hypertension probably related to him being uploaded and I did not think he has primary pulmonary hypertension, nuclear stress test was nonischemic.  Due to 10 yr ASCVD risk 17.7%, he was started on Crestor 5 mg daily.  We had preferred to have coronary CTA performed; however, could not get approval from insurance. He now presents for f.u and still rides about 30-35 miles a week biking. He is stable. He feels well. He was evaluated by pulmonary medicine in Jan 2020 and felt that the dyspnea is from cardiac etiology.   Past Medical History:  Diagnosis Date  . BPH (benign prostatic hyperplasia)   . HLD (hyperlipidemia)   . Hypothyroidism    Past Surgical History:  Procedure Laterality Date  . FEMUR FRACTURE SURGERY     Social History   Socioeconomic History  . Marital status: Married    Spouse name: Not on file  . Number of children: 4  . Years of education: Not on file  . Highest education level: Not on file  Occupational History  . Not on file  Social Needs  . Financial resource strain: Not on file  . Food insecurity    Worry: Not on file    Inability: Not on file  . Transportation needs    Medical: Not on file    Non-medical: Not on file  Tobacco Use  . Smoking status:  Never Smoker  . Smokeless tobacco: Never Used  Substance and Sexual Activity  . Alcohol use: Yes    Comment: occasionally   . Drug use: Never  . Sexual activity: Not on file  Lifestyle  . Physical activity    Days per week: Not on file    Minutes per session: Not on file  . Stress: Not on file  Relationships  . Social Herbalist on phone: Not on file    Gets together: Not on file    Attends religious service: Not on file    Active member of club or organization: Not on file    Attends meetings of clubs or organizations: Not on file    Relationship status: Not on file  . Intimate partner violence    Fear of current or ex partner: Not on file    Emotionally abused: Not on file    Physically abused: Not on file    Forced sexual activity: Not on file  Other Topics Concern  . Not on file  Social History Narrative  . Not on file   ROS  Review of Systems  Constitution: Negative for chills, decreased appetite, malaise/fatigue and weight gain.  Cardiovascular: Negative for dyspnea on exertion, leg swelling and syncope.  Endocrine: Negative for cold intolerance.  Hematologic/Lymphatic: Does not bruise/bleed easily.  Musculoskeletal: Positive for  back pain. Negative for joint swelling.  Gastrointestinal: Negative for abdominal pain, anorexia, change in bowel habit, hematochezia and melena.  Neurological: Negative for headaches and light-headedness.  Psychiatric/Behavioral: Negative for depression and substance abuse.  All other systems reviewed and are negative.  Objective   Vitals with BMI 09/02/2019 11/23/2018 09/28/2018  Height '5\' 7"'  '5\' 7"'  5' 7.5"  Weight 170 lbs 170 lbs 169 lbs 10 oz  BMI 26.62 42.87 68.11  Systolic 572 620 355  Diastolic 74 68 80  Pulse 69 82 92    Blood pressure 128/74, pulse 69, temperature (!) 97.1 F (36.2 C), height '5\' 7"'  (1.702 m), weight 170 lb (77.1 kg), SpO2 98 %. Body mass index is 26.63 kg/m.   Physical Exam  HENT:  Head:  Atraumatic.  Eyes: Conjunctivae are normal.  Neck: Neck supple. No JVD present. No thyromegaly present.  Cardiovascular: Normal rate, regular rhythm, normal heart sounds and intact distal pulses. Exam reveals no gallop.  No murmur heard. No leg edema, no JVD.  Pulmonary/Chest: Effort normal and breath sounds normal.  Abdominal: Soft. Bowel sounds are normal.  Musculoskeletal: Normal range of motion.  Neurological: He is alert.  Skin: Skin is warm and dry.  Psychiatric: He has a normal mood and affect.   Laboratory examination:   Labs 07/02/2018: H/H 14/41.  MCV 91.  Platelets 204 Glucose 94.  BUN/creatinine 23/1.44.  EGFR of 48/58.  Sodium 142, potassium 5.4.  Rest of the CMP normal Cholesterol 207, triglycerides 169, HDL 50, LDL 124 TSH 0.8 normal  No results for input(s): NA, K, CL, CO2, GLUCOSE, BUN, CREATININE, CALCIUM, GFRNONAA, GFRAA in the last 8760 hours. CMP 04/16/2008 04/15/2008 04/14/2008  Glucose 112(H) 116(H) 120(H)  BUN '9 9 15  ' Creatinine 1.23 1.27 1.25  Sodium 139 136 133(L)  Potassium 3.7 3.9 3.8  Chloride 104 105 101  CO2 '28 28 25  ' Calcium 8.4 8.0(L) 7.9(L)   CBC 04/16/2008 04/15/2008 04/14/2008  WBC 8.7 10.9(H) 8.0  Hemoglobin 9.4(L) 9.8 DELTA CHECK NOTED POST TRANSFUSION SPECIMEN(L) 7.9 REPEATED TO VERIFY CRITICAL RESULT CALLED TO, READ BACK BY AND VERIFIED WITH: MELTON,D RN 04/14/08 0645 WOOTEN,K(LL)  Hematocrit 27.0(L) 27.5(L) 22.2(L)  Platelets 188 138(L) 137(L)   Lipid Panel  No results found for: CHOL, TRIG, HDL, CHOLHDL, VLDL, LDLCALC, LDLDIRECT HEMOGLOBIN A1C No results found for: HGBA1C, MPG TSH No results for input(s): TSH in the last 8760 hours. Medications and allergies  Not on File   Prior to Admission medications   Medication Sig Start Date End Date Taking? Authorizing Provider  Cholecalciferol (VITAMIN D-3) 25 MCG (1000 UT) CAPS Take 1 tablet by mouth daily.   Yes [provider]  levothyroxine (SYNTHROID, LEVOTHROID) 137 MCG tablet  Take 137 mcg by mouth daily before breakfast.    [provider]  rosuvastatin (CRESTOR) 5 MG tablet TAKE 1 TABLET BY MOUTH ONCE DAILY 07/03/19   Adrian Prows, MD     Current Outpatient Medications  Medication Instructions  . Cholecalciferol (VITAMIN D-3) 25 MCG (1000 UT) CAPS 1 tablet, Oral, Daily  . levothyroxine (SYNTHROID) 137 mcg, Oral, Daily before breakfast  . rosuvastatin (CRESTOR) 5 MG tablet TAKE 1 TABLET BY MOUTH ONCE DAILY    Radiology:   Pulmonary consult and CT chest 11/14/18:  Pulmonary function test have revealed normal spirometry with a mildly reduced DLCO of 66%.  HRCT of the chest with no evidence of ILD.  He did have a small upper lobe pulmonary nodule. No parenchymal disease as well.  Cardiac Studies:   Exercise myoview stress 07/27/2018: 1. The patient performed treadmill exercise using Bruce protocol, completing 10 minutes. The patient completed an estimated workload of 11.8 METS, reaching 85% of the maximum predicted heart rate. Normal hemodynamic response. No stress symptoms reported. Normal exercise capacity No ischemic changes seen on stress electrocardiogram. 2. The overall quality of the study is good. There is no evidence of abnormal lung activity. Stress and rest SPECT images demonstrate homogeneous tracer distribution throughout the myocardium. Gated SPECT imaging reveals normal myocardial thickening and wall motion. The left ventricular ejection fraction visually appears normal, although calculated at 38%.Clinical correlation with echocardiogram recommended. 3. Intermediate risk study due low LVEF.  Echocardiogram 08/15/2018: 1. Left ventricle cavity is normal in size. Mild concentric hypertrophy of the left ventricle. Normal global wall motion. Calculated EF 62%. 2. Trace aortic regurgitation. 3. Mild to moderate mitral regurgitation. 4. Mild tricuspid regurgitation. Borderline pulmonary hypertension with approx. PA syst. pressure of 32 mm of Hg.  5. IVC is dilated with a respiratory response of <50%, consistent with elevated RA pressure.  Assessment     ICD-10-CM   1. Dyspnea on exertion  R06.00   2. Essential hypertension  I10 EKG 12-Lead  3. Hypercholesteremia  E78.00     EKG 09/02/2019: Normal sinus rhythm with rate of 66 bpm, normal axis.  No evidence of ischemia, normal EKG. No significant change from EKG 07/13/2018   Recommendations:   Patient is being seen for one year follow-up of mildly decreased exercise tolerance compared to his colleagues any gross on bicycling day.  He continues to exercise regularly, rides bikes for at least 30-35 miles 3-4 times a week, without any chest pain.  At this point I have reviewed the pulmonary recommendation, indeed if he even does have coronary artery disease, would recommend continued primary prevention at this point given low risk stress test (clinically) and normal LVEF by echo.  He is already on a statin which had started him on last year, he does need lipid profile testing.  I would like to target his LDL goal to around 70 mg/dL. He prefers to have it done by his PCP.   His physical examination is completely normal.  EKG is also normal.  From cardiac standpoint I would not recommend any further evaluation.  I'll see him back on a p.r.n. basis.  Adrian Prows, MD, [facility] 09/02/2019, 9:51 AM [facility] [place] Pager: [phone] Office: [phone] If no answer Cell [phone]

## 2019-09-09 DIAGNOSIS — E039 Hypothyroidism, unspecified: Secondary | ICD-10-CM | POA: Diagnosis not present

## 2019-09-09 DIAGNOSIS — E782 Mixed hyperlipidemia: Secondary | ICD-10-CM | POA: Diagnosis not present

## 2019-10-23 ENCOUNTER — Telehealth: Payer: Self-pay | Admitting: Pulmonary Disease

## 2019-10-23 NOTE — Telephone Encounter (Signed)
Pt called back and wants to get the COVID vaccine and feels comfortable leaving the house - CB# [phone] - may leave message

## 2019-10-23 NOTE — Telephone Encounter (Signed)
Dr. Valeta Harms, this patient was last seen by you on 11/23/18. Based on the message received from the patient please advise if it would be appropriate to wait on testing. Thank you much.

## 2019-10-23 NOTE — Telephone Encounter (Signed)
Returned patient call. Made him aware of Dr. Juline Patch response to postpone the CT.  Message forwarded to [facility] as FYI. There order was placed 11/23/18 by Dr. Valeta Harms. Not sure where it is on your schedule for call back or appointment set up.

## 2019-10-23 NOTE — Telephone Encounter (Signed)
I had pt's CT order.  I have put it in my folder to schedule for 04/2020.  Nothing further needed at this time.

## 2019-10-23 NOTE — Telephone Encounter (Signed)
PCCM:  Yes ok to reschedule out for lung follow up ct within the next 6 months  [facility] 10/23/2019 1:42 PM

## 2019-10-23 NOTE — Telephone Encounter (Signed)
LMTCB

## 2019-12-02 NOTE — Telephone Encounter (Signed)
Please read

## 2019-12-07 ENCOUNTER — Ambulatory Visit: Payer: TRICARE For Life (TFL)

## 2019-12-14 ENCOUNTER — Ambulatory Visit: Payer: Medicare Other | Attending: Internal Medicine

## 2019-12-14 DIAGNOSIS — Z23 Encounter for immunization: Secondary | ICD-10-CM | POA: Insufficient documentation

## 2019-12-14 NOTE — Progress Notes (Signed)
   Covid-19 Vaccination Clinic  Name:  Jeffrey Suarez    MRN: [id number] DOB: 09-Apr-1946  12/14/2019  [name] was observed post Covid-19 immunization for 15 minutes without incidence. He was provided with Vaccine Information Sheet and instruction to access the V-Safe system.   [name] was instructed to call 911 with any severe reactions post vaccine: Marland Kitchen Difficulty breathing  . Swelling of your face and throat  . A fast heartbeat  . A bad rash all over your body  . Dizziness and weakness    Immunizations Administered    Name Date Dose VIS Date Route   Pfizer COVID-19 Vaccine 12/14/2019  4:38 PM 0.3 mL 10/18/2019 Intramuscular   Manufacturer: [place]   Lot: [id number]   [place]: [id number]

## 2019-12-18 ENCOUNTER — Ambulatory Visit: Payer: TRICARE For Life (TFL)

## 2020-01-08 ENCOUNTER — Ambulatory Visit: Payer: Medicare Other | Attending: Internal Medicine

## 2020-01-08 DIAGNOSIS — Z23 Encounter for immunization: Secondary | ICD-10-CM | POA: Insufficient documentation

## 2020-01-08 NOTE — Progress Notes (Signed)
   Covid-19 Vaccination Clinic  Name:  Jeffrey Suarez    MRN: [id number] DOB: April 11, 1946  01/08/2020  [name] was observed post Covid-19 immunization for 15 minutes without incident. He was provided with Vaccine Information Sheet and instruction to access the V-Safe system.   [name] was instructed to call 911 with any severe reactions post vaccine: Marland Kitchen Difficulty breathing  . Swelling of face and throat  . A fast heartbeat  . A bad rash all over body  . Dizziness and weakness   Immunizations Administered    Name Date Dose VIS Date Route   Pfizer COVID-19 Vaccine 01/08/2020 12:39 PM 0.3 mL 10/18/2019 Intramuscular   Manufacturer: [place]   Lot: [id number]   [place]: [id number]

## 2020-01-30 ENCOUNTER — Other Ambulatory Visit: Payer: Self-pay | Admitting: Cardiology

## 2020-03-10 ENCOUNTER — Other Ambulatory Visit: Payer: Self-pay | Admitting: Pulmonary Disease

## 2020-03-10 DIAGNOSIS — R911 Solitary pulmonary nodule: Secondary | ICD-10-CM

## 2020-04-22 ENCOUNTER — Ambulatory Visit (HOSPITAL_BASED_OUTPATIENT_CLINIC_OR_DEPARTMENT_OTHER)
Admission: RE | Admit: 2020-04-22 | Discharge: 2020-04-22 | Disposition: A | Discharge status: Home / Self Care | Payer: Medicare Other | Source: Ambulatory Visit | Attending: Pulmonary Disease | Admitting: Pulmonary Disease

## 2020-04-22 ENCOUNTER — Other Ambulatory Visit: Payer: Self-pay

## 2020-04-22 DIAGNOSIS — R911 Solitary pulmonary nodule: Secondary | ICD-10-CM

## 2020-04-27 ENCOUNTER — Other Ambulatory Visit: Payer: Self-pay

## 2020-04-27 ENCOUNTER — Ambulatory Visit (INDEPENDENT_AMBULATORY_CARE_PROVIDER_SITE_OTHER): Payer: Medicare Other | Admitting: Primary Care

## 2020-04-27 ENCOUNTER — Encounter: Payer: Self-pay | Admitting: Primary Care

## 2020-04-27 DIAGNOSIS — R0600 Dyspnea, unspecified: Secondary | ICD-10-CM | POA: Insufficient documentation

## 2020-04-27 DIAGNOSIS — I2510 Atherosclerotic heart disease of native coronary artery without angina pectoris: Secondary | ICD-10-CM | POA: Diagnosis not present

## 2020-04-27 DIAGNOSIS — I2584 Coronary atherosclerosis due to calcified coronary lesion: Secondary | ICD-10-CM | POA: Diagnosis not present

## 2020-04-27 DIAGNOSIS — R0609 Other forms of dyspnea: Secondary | ICD-10-CM

## 2020-04-27 DIAGNOSIS — R918 Other nonspecific abnormal finding of lung field: Secondary | ICD-10-CM | POA: Diagnosis not present

## 2020-04-27 NOTE — Assessment & Plan Note (Addendum)
-   Improved, no acute symptoms today - Normal spirometry with pulmonary, mild decreased diffusion defect. No evidence of ILD on CT imaging.  - Low risk stress test, normal echocardiogram, normal EKG with cardiology - If patient develops shortness of breath or wheezing may consider Albuterol rescue inhaler to use as needed  - Follow up as needed basis with pulmonary

## 2020-04-27 NOTE — Progress Notes (Signed)
PCCM: thanks for seeing him  Garner Nash, DO [facility] 04/27/2020 5:37 PM

## 2020-04-27 NOTE — Assessment & Plan Note (Addendum)
-   CT chest in June 2021 showed stable tiny bilateral pulmonary nodules measuring less than 3 mm.  He has demonstrated stability for 17 months with no new nodules.   - No further imaging needed at this time

## 2020-04-27 NOTE — Progress Notes (Signed)
_0  ID: Jeffrey Suarez, male    DOB: [birth year], 74 y.o.   MRN: [id number]  Chief Complaint  Patient presents with  . Follow-up    pt is here to review ct scan with provider    Referring provider: Lyman Bishop, DO  HPI: 74 year old male, never smoked.  Past medical history significant for BPH, hypothyroidism.  Patient of Dr. Valeta Harms seen for original consult November/2019 for dyspnea on exertion.  He was last seen in our office January 2020.  Previous [facility] pulmonary encounters: PMH of BPH, hypothyroidism. Over the past several months he has noticed increasing DOE on exertion. He has noticed worsening with significant heat exposure. This SOB has been present for many years. He noticed this even as a child and growing up exercising and play sports. He does feel like he looses a lot of fluid while riding his bike. He is currently an avid biker. Riding his bicycle up to 40+ miles at a time. He rides up to two times per week.   The patient was seen by [facility] cardiology.  He had a stress test which was read as an intermediate risk study due to a low LVEF calculated at 38% on the gated SPECT imaging.  Echocardiogram however completed following this on 08/15/2018 was read as a normal size, mild concentric hypertrophy and a calculated EF 62%.  He does have mild MR and TR.  His estimated pulmonary systolic pressure was 32.  He did have a dilated IVC on echo with elevated RA pressures.  He does not have any chest tightness or wheezing with exertion.  To his knowledge he has never had any wheezing respiratory symptoms before.  He does not have cough.  He tries to maintain hydrated during his exercise and cycling.  If it is extremely hot day meaning above 68 F he refuses to ride on these days because of worsening symptomatology.  OV 11/23/2018: Presents today for follow-up regarding high-resolution CT scan.  Overall his symptoms are may be slightly better since he was last seen by [place].  He still  has been writing regularly.  He rides his bike approximately 120 miles a week.  He recently completed a very long distance bike ride several weeks ago at 100 miles.  He does well when he is on flat surfaces but still having some shortness of breath when climbing hills and falls back behind his partners.  He had pulmonary function test which revealed normal spirometry and a mildly reduced DLCO of 66%.  Therefore we ended up having a high-resolution CT scan to rule out any evidence of ILD.  There was no evidence of ILD.  He did have a 3 mm lung nodule.  We reviewed his images today in the office.   04/27/2020-interim history Patient presents today for regular follow-up to review recent CT scan results.  He has no complaints. He is doing really well, continues to cycle. No reported shortness of breath or any other respiratory symptoms. He breaths through his mouth when exercising. Denies chest tightness or wheezing.    CT chest in June showed stable tiny bilateral pulmonary nodules measuring less than 3 mm.  He has demonstrated stability for 17 months with no new nodules.  No further imaging needed at this time.  Incidental findings of coronary artery calcifications/aortic arthrosclerosis.  He is on 5 mg of Crestor daily prescribed by Dr. Einar Gip.  He had a low risk stress test. He had normal spirometry with mildly reduced DLCO of  66%. HRCT in January 2021 showed no evidence of ILD.   Imaging: CT chest 04/22/2020-stable tiny bilateral pulmonary nodules all measuring less than 3 mm.  Exam shows stability for 17 months.  No new or progressive nodules.  No further imaging needed.  The dental finding of coronary artery calcifications.   Not on File  Immunization History  Administered Date(s) Administered  . Influenza, High Dose Seasonal PF 08/20/2018  . Influenza-Unspecified 08/26/2019  . PFIZER SARS-COV-2 Vaccination 12/14/2019, 01/08/2020    Past Medical History:  Diagnosis Date  . BPH (benign  prostatic hyperplasia)   . HLD (hyperlipidemia)   . Hypothyroidism     Tobacco History: Social History   Tobacco Use  Smoking Status Never Smoker  Smokeless Tobacco Never Used   Counseling given: Not Answered   Outpatient Medications Prior to Visit  Medication Sig Dispense Refill  . Cholecalciferol (VITAMIN D-3) 25 MCG (1000 UT) CAPS Take 1 tablet by mouth daily.    Marland Kitchen levothyroxine (SYNTHROID, LEVOTHROID) 137 MCG tablet Take 137 mcg by mouth daily before breakfast.    . rosuvastatin (CRESTOR) 5 MG tablet TAKE 1 TABLET BY MOUTH EVERY DAY 90 tablet 1   No facility-administered medications prior to visit.    Review of Systems  Review of Systems  Constitutional: Negative.   Respiratory: Negative for cough, chest tightness, shortness of breath and wheezing.   Cardiovascular: Negative for chest pain.    Physical Exam  BP 118/78 (BP Location: Left Arm, Cuff Size: Normal)   Pulse 67   Temp (!) 96 F (35.6 C) (Axillary)   Ht _0  (1.753 m)   Wt 167 lb 3.2 oz (75.8 kg)   SpO2 99%   BMI 24.69 kg/m  Physical Exam Constitutional:      Appearance: Normal appearance.  Cardiovascular:     Rate and Rhythm: Normal rate and regular rhythm.     Comments: RRR Pulmonary:     Effort: No respiratory distress.     Breath sounds: No wheezing or rales.     Comments: Lungs clear  Skin:    General: Skin is warm and dry.  Neurological:     General: No focal deficit present.     Mental Status: He is alert and oriented to person, place, and time. Mental status is at baseline.  Psychiatric:        Mood and Affect: Mood normal.        Behavior: Behavior normal.        Thought Content: Thought content normal.      Lab Results:  CBC    Component Value Date/Time   WBC 8.7 04/16/2008 0530   RBC 2.94 (L) 04/16/2008 0530   HGB 9.4 (L) 04/16/2008 0530   HCT 27.0 (L) 04/16/2008 0530   PLT 188 04/16/2008 0530   MCV 91.6 04/16/2008 0530   MCHC 34.7 04/16/2008 0530   RDW 13.4  04/16/2008 0530    BMET    Component Value Date/Time   NA 139 04/16/2008 0530   K 3.7 04/16/2008 0530   CL 104 04/16/2008 0530   CO2 28 04/16/2008 0530   GLUCOSE 112 (H) 04/16/2008 0530   BUN 9 04/16/2008 0530   CREATININE 1.23 04/16/2008 0530   CALCIUM 8.4 04/16/2008 0530   GFRNONAA 60 (L) 04/16/2008 0530   GFRAA  04/16/2008 0530    >60        The eGFR has been calculated using the MDRD equation. This calculation has not been validated in all clinical  BNP No results found for: BNP  ProBNP No results found for: PROBNP  Imaging: CT Chest Wo Contrast  Result Date: 04/22/2020 CLINICAL DATA:  Follow-up pulmonary nodules. EXAM: CT CHEST WITHOUT CONTRAST TECHNIQUE: Multidetector CT imaging of the chest was performed following the standard protocol without IV contrast. COMPARISON:  High-resolution chest CT 11/14/2018 FINDINGS: Cardiovascular: Mild aortic atherosclerosis. No aortic aneurysm. Heart is normal in size. There are coronary artery calcifications. No pericardial effusion. Mediastinum/Nodes: No enlarged mediastinal lymph nodes. No axillary adenopathy. No evidence of hilar adenopathy, evaluation limited in the absence of IV contrast. Decompressed esophagus. No suspicious thyroid nodule. Lungs/Pleura: Probable intrapulmonary lymph node adjacent to the right minor fissure is unchanged from prior exam, series 3, image 82. There are stable bilateral tiny pulmonary nodules, all measuring less than 3 mm. For example right upper lobe nodules series 3 images 50 and 55. left lower lobe subpleural nodule series 3, image 108, and right lower lobe pleural based nodule series 3, image 109. No evidence of new or progressive pulmonary nodule. No confluent airspace disease. No pleural fluid. No pulmonary edema. Trachea and bronchi are patent. Upper Abdomen: No acute or unexpected findings. Musculoskeletal: There are no acute or suspicious osseous abnormalities. IMPRESSION: 1. Stable tiny bilateral  pulmonary nodules, all measuring less than 3 mm. This exam demonstrates nodule stability for 17 months. No new or progressive nodules. No dedicated further imaging follow-up is needed. 2. Coronary artery calcifications. Aortic Atherosclerosis (ICD10-I70.0). Electronically Signed   By: Keith Rake M.D.   On: 04/22/2020 12:21     Assessment & Plan:   Pulmonary nodules/lesions, multiple - CT chest in June 2021 showed stable tiny bilateral pulmonary nodules measuring less than 3 mm.  He has demonstrated stability for 17 months with no new nodules.   - No further imaging needed at this time   Dyspnea - Improved, no acute symptoms today - Normal spirometry with pulmonary, mild decreased diffusion defect. No evidence of ILD on CT imaging.  - Low risk stress test, normal echocardiogram, normal EKG with cardiology - If patient develops shortness of breath or wheezing may consider Albuterol rescue inhaler to use as needed  - Follow up as needed basis with pulmonary   Coronary artery calcification - Continue Crestor 35m daily  - Continue daily exercise/cycling - Follow up with PCP (and cardiology as needed basis)      EMartyn Ehrich NP 04/27/2020

## 2020-04-27 NOTE — Assessment & Plan Note (Addendum)
-   Continue Crestor 5mg  daily  - Continue daily exercise/cycling - Follow up with PCP (and cardiology as needed basis)

## 2020-04-27 NOTE — Patient Instructions (Addendum)
Results: - CT chest in June showed stable tiny bilateral pulmonary nodules measuring less than 3 mm.  He has demonstrated stability for 17 months with no new nodules.  No further imaging needed at this time.  Incidental findings of coronary artery calcification. - Normal spirometry with pulmonary - Low risk stress test, normal echocardiogram, normal EKG with cardiology.  Recommendations: - Continue Crestor 5 mg daily - Continue daily exercise/cycling as you are  - If you develop shortness of breath or wheezing may consider Albuterol rescue inhaler to use as needed   Follow-up: - Continue regular follow-up with primary care provider  - FU with pulmonary Dr. Valeta Harms and Cardiology Dr. Einar Gip as needed basisas

## 2020-07-26 ENCOUNTER — Other Ambulatory Visit: Payer: Self-pay | Admitting: Cardiology

## 2020-12-14 DIAGNOSIS — L821 Other seborrheic keratosis: Secondary | ICD-10-CM | POA: Diagnosis not present

## 2020-12-14 DIAGNOSIS — D1801 Hemangioma of skin and subcutaneous tissue: Secondary | ICD-10-CM | POA: Diagnosis not present

## 2020-12-14 DIAGNOSIS — Z85828 Personal history of other malignant neoplasm of skin: Secondary | ICD-10-CM | POA: Diagnosis not present

## 2021-01-28 ENCOUNTER — Other Ambulatory Visit: Payer: Self-pay | Admitting: Cardiology

## 2021-03-10 DIAGNOSIS — K4090 Unilateral inguinal hernia, without obstruction or gangrene, not specified as recurrent: Secondary | ICD-10-CM | POA: Diagnosis not present

## 2021-04-09 DIAGNOSIS — K4020 Bilateral inguinal hernia, without obstruction or gangrene, not specified as recurrent: Secondary | ICD-10-CM | POA: Diagnosis not present

## 2021-04-22 DIAGNOSIS — R059 Cough, unspecified: Secondary | ICD-10-CM | POA: Diagnosis not present

## 2021-04-22 DIAGNOSIS — Z03818 Encounter for observation for suspected exposure to other biological agents ruled out: Secondary | ICD-10-CM | POA: Diagnosis not present

## 2021-04-22 DIAGNOSIS — R0981 Nasal congestion: Secondary | ICD-10-CM | POA: Diagnosis not present

## 2021-04-22 DIAGNOSIS — R5383 Other fatigue: Secondary | ICD-10-CM | POA: Diagnosis not present

## 2021-07-21 DIAGNOSIS — T8529XA Other mechanical complication of intraocular lens, initial encounter: Secondary | ICD-10-CM | POA: Diagnosis not present

## 2021-07-21 DIAGNOSIS — H524 Presbyopia: Secondary | ICD-10-CM | POA: Diagnosis not present

## 2021-07-21 DIAGNOSIS — Z961 Presence of intraocular lens: Secondary | ICD-10-CM | POA: Diagnosis not present

## 2021-07-21 DIAGNOSIS — H52223 Regular astigmatism, bilateral: Secondary | ICD-10-CM | POA: Diagnosis not present

## 2021-07-21 DIAGNOSIS — H43812 Vitreous degeneration, left eye: Secondary | ICD-10-CM | POA: Diagnosis not present

## 2021-07-27 ENCOUNTER — Other Ambulatory Visit: Payer: Self-pay | Admitting: Cardiology

## 2021-08-11 ENCOUNTER — Other Ambulatory Visit: Payer: Self-pay

## 2021-08-11 ENCOUNTER — Ambulatory Visit: Payer: Medicare Other | Admitting: Cardiology

## 2021-08-11 ENCOUNTER — Encounter: Payer: Self-pay | Admitting: Cardiology

## 2021-08-11 VITALS — BP 117/76 | HR 76 | Temp 98.7°F | Resp 17 | Ht 69.0 in | Wt 171.8 lb

## 2021-08-11 DIAGNOSIS — Z136 Encounter for screening for cardiovascular disorders: Secondary | ICD-10-CM

## 2021-08-11 DIAGNOSIS — E7800 Pure hypercholesterolemia, unspecified: Secondary | ICD-10-CM

## 2021-08-11 MED ORDER — ROSUVASTATIN CALCIUM 5 MG PO TABS: 5.0000 mg | ORAL_TABLET | Freq: Every day | ORAL | 3 refills | Status: DC

## 2021-08-11 NOTE — Progress Notes (Signed)
Primary Physician/Referring:  Storey, Basile  Patient ID: Jeffrey Suarez, male    DOB: 1946-01-21, 75 y.o.   MRN: [id number]  Chief Complaint  Patient presents with   Follow-up    2 years   Dyspnea on exertion   HPI:    GERARDO CAIAZZO  is a 75 y.o.  Caucasian male with no significant comorbidities. He loves to live outdoors and is busy with cycling, camping, hiking.  He had seen me 2 years ago for cardiac risk stratification.  He continues to remain active but not as active as he was before, previously riding bikes from 100 miles a week now he is writing about 20 to 30 miles a week.  He does have mild hyperlipidemia. Due to 10 yr ASCVD risk 17.7%, he was started on Crestor 5 mg daily.  He is presently asymptomatic. Past Medical History:  Diagnosis Date   BPH (benign prostatic hyperplasia)    HLD (hyperlipidemia)    Hypothyroidism    Past Surgical History:  Procedure Laterality Date   FEMUR FRACTURE SURGERY     Social History   Tobacco Use   Smoking status: Never   Smokeless tobacco: Never  Substance Use Topics   Alcohol use: Yes    Comment: occasionally    Marital Status: Married   ROS  Review of Systems  Cardiovascular:  Negative for chest pain, dyspnea on exertion and leg swelling.  Gastrointestinal:  Negative for melena.  Objective   Vitals with BMI 08/11/2021 04/27/2020 09/02/2019  Height 5' 9" 5' 9" 5' 7"  Weight 171 lbs 13 oz 167 lbs 3 oz 170 lbs  BMI 25.36 42.59 56.38  Systolic 756 433 295  Diastolic 76 78 74  Pulse 76 67 69    Blood pressure 117/76, pulse 76, temperature 98.7 F (37.1 C), temperature source Temporal, resp. rate 17, height 5' 9" (1.753 m), weight 171 lb 12.8 oz (77.9 kg), SpO2 99 %. Body mass index is 25.37 kg/m.   Physical Exam Neck:     Vascular: No carotid bruit or JVD.  Cardiovascular:     Rate and Rhythm: Normal rate and regular rhythm.     Pulses: Intact distal pulses.     Heart sounds: Normal heart  sounds. No murmur heard.   No gallop.  Pulmonary:     Effort: Pulmonary effort is normal.     Breath sounds: Normal breath sounds.  Abdominal:     General: Bowel sounds are normal.     Palpations: Abdomen is soft.  Musculoskeletal:        General: No swelling.   Laboratory examination:   Cholesterol, total 155.000 m 09/09/2019 HDL 51.000 mg 09/09/2019 LDL 83.000 mg 09/09/2019 Triglycerides 109.000 m 09/09/2019   Labs 07/02/2018: H/H 14/41.  MCV 91.  Platelets 204 Glucose 94.  BUN/creatinine 23/1.44.  EGFR of 48/58.  Sodium 142, potassium 5.4.  Rest of the CMP normal Cholesterol 207, triglycerides 169, HDL 50, LDL 124 TSH 0.8 normal   Medications and allergies   Allergies  Allergen Reactions   Terazosin Swelling     Current Outpatient Medications  Medication Instructions   Cholecalciferol (VITAMIN D-3) 25 MCG (1000 UT) CAPS 1 tablet, Oral, Daily   levothyroxine (SYNTHROID) 137 mcg, Oral, Daily before breakfast   rosuvastatin (CRESTOR) 5 mg, Oral, Daily    Radiology:   Pulmonary consult and CT chest 11/14/18:  Pulmonary function test have revealed normal spirometry with a mildly reduced DLCO of 66%.  HRCT of the chest with no evidence of ILD.  He did have a small upper lobe pulmonary nodule. No parenchymal disease as well.     Cardiac Studies:   Exercise myoview stress 07/27/2018: 1. The patient performed treadmill exercise using Bruce protocol, completing 10 minutes. The patient completed an estimated workload of 11.8 METS, reaching 85% of the maximum predicted heart rate. Normal hemodynamic response. No stress symptoms reported. Normal exercise capacity No ischemic changes seen on stress electrocardiogram. 2. The overall quality of the study is good. There is no evidence of abnormal lung activity. Stress and rest SPECT images demonstrate homogeneous tracer distribution throughout the myocardium. Gated SPECT imaging reveals normal myocardial thickening and wall motion. The  left ventricular ejection fraction visually appears normal, although calculated at 38%.Clinical correlation with echocardiogram recommended. 3. Intermediate risk study due low LVEF.  Echocardiogram 08/15/2018: 1. Left ventricle cavity is normal in size. Mild concentric hypertrophy of the left ventricle. Normal global wall motion. Calculated EF 62%. 2. Trace aortic regurgitation. 3. Mild to moderate mitral regurgitation. 4. Mild tricuspid regurgitation. Borderline pulmonary hypertension with approx. PA syst. pressure of 32 mm of Hg. 5. IVC is dilated with a respiratory response of <50%, consistent with elevated RA pressure.  EKG:  EKG 08/11/2021: Normal sinus rhythm at rate of 65 bpm, normal axis.  No evidence of ischemia, normal EKG.  Assessment     ICD-10-CM   1. Hypercholesteremia  E78.00 rosuvastatin (CRESTOR) 5 MG tablet    2. Encounter for screening for coronary artery disease  Z13.6 EKG 12-Lead     Recommendations:  BETTY BROOKS is a 75 y.o. Caucasian male with no significant comorbidities. He loves to live outdoors and is busy with cycling, camping, hiking.  He had seen me 2 years ago for cardiac risk stratification.  He continues to remain active but not as active as he was before, previously riding bikes from 100 miles a week now he is writing about 20 to 30 miles a week.  He does have mild hyperlipidemia. Due to 10 yr ASCVD risk 17.7%, he was started on Crestor 5 mg daily.  I have discussed with him that in a person who is so active and completely asymptomatic, no further cardiac evaluation is necessary.  Advised him to not to slow down due to his age, continue to remain active.  I reviewed his labs, with starting Crestor at 5 mg once a day, his LDL was at goal 2 years ago.  He does need repeat lipid profile testing.  However he has not seen his PCP in a while, he plans to set up a complete physical examination.  Advised him to do so and from cardiac standpoint no further  evaluation is indicated, no change in his EKG or his physical exam, I renewed his prescription for Crestor, I will see him back on a as needed basis.   Adrian Prows, MD, [facility] 08/11/2021, 4:21 PM Office: [phone] Fax: [phone] Pager: [phone]

## 2021-10-08 DIAGNOSIS — M170 Bilateral primary osteoarthritis of knee: Secondary | ICD-10-CM | POA: Diagnosis not present

## 2021-11-09 DIAGNOSIS — E039 Hypothyroidism, unspecified: Secondary | ICD-10-CM | POA: Diagnosis not present

## 2021-11-09 DIAGNOSIS — N400 Enlarged prostate without lower urinary tract symptoms: Secondary | ICD-10-CM | POA: Diagnosis not present

## 2021-11-09 DIAGNOSIS — E782 Mixed hyperlipidemia: Secondary | ICD-10-CM | POA: Diagnosis not present

## 2021-11-09 DIAGNOSIS — N289 Disorder of kidney and ureter, unspecified: Secondary | ICD-10-CM | POA: Diagnosis not present

## 2021-11-10 DIAGNOSIS — Z0000 Encounter for general adult medical examination without abnormal findings: Secondary | ICD-10-CM | POA: Diagnosis not present

## 2021-11-10 DIAGNOSIS — E782 Mixed hyperlipidemia: Secondary | ICD-10-CM | POA: Diagnosis not present

## 2021-11-10 DIAGNOSIS — E039 Hypothyroidism, unspecified: Secondary | ICD-10-CM | POA: Diagnosis not present

## 2021-11-10 DIAGNOSIS — K4020 Bilateral inguinal hernia, without obstruction or gangrene, not specified as recurrent: Secondary | ICD-10-CM | POA: Diagnosis not present

## 2021-11-10 DIAGNOSIS — M542 Cervicalgia: Secondary | ICD-10-CM | POA: Diagnosis not present

## 2021-11-10 DIAGNOSIS — N401 Enlarged prostate with lower urinary tract symptoms: Secondary | ICD-10-CM | POA: Diagnosis not present

## 2021-11-15 ENCOUNTER — Other Ambulatory Visit: Payer: Self-pay | Admitting: Family Medicine

## 2021-11-15 ENCOUNTER — Ambulatory Visit
Admission: RE | Admit: 2021-11-15 | Discharge: 2021-11-15 | Disposition: A | Discharge status: Home / Self Care | Payer: Medicare Other | Source: Ambulatory Visit | Attending: Family Medicine | Admitting: Family Medicine

## 2021-11-15 DIAGNOSIS — M47812 Spondylosis without myelopathy or radiculopathy, cervical region: Secondary | ICD-10-CM | POA: Diagnosis not present

## 2021-11-15 DIAGNOSIS — R52 Pain, unspecified: Secondary | ICD-10-CM

## 2021-11-15 DIAGNOSIS — M542 Cervicalgia: Secondary | ICD-10-CM | POA: Diagnosis not present

## 2021-12-02 DIAGNOSIS — M25512 Pain in left shoulder: Secondary | ICD-10-CM | POA: Diagnosis not present

## 2021-12-02 DIAGNOSIS — M542 Cervicalgia: Secondary | ICD-10-CM | POA: Diagnosis not present

## 2021-12-02 DIAGNOSIS — R293 Abnormal posture: Secondary | ICD-10-CM | POA: Diagnosis not present

## 2021-12-06 DIAGNOSIS — M542 Cervicalgia: Secondary | ICD-10-CM | POA: Diagnosis not present

## 2021-12-06 DIAGNOSIS — M25512 Pain in left shoulder: Secondary | ICD-10-CM | POA: Diagnosis not present

## 2021-12-06 DIAGNOSIS — R293 Abnormal posture: Secondary | ICD-10-CM | POA: Diagnosis not present

## 2021-12-09 DIAGNOSIS — M25512 Pain in left shoulder: Secondary | ICD-10-CM | POA: Diagnosis not present

## 2021-12-09 DIAGNOSIS — M542 Cervicalgia: Secondary | ICD-10-CM | POA: Diagnosis not present

## 2021-12-09 DIAGNOSIS — R293 Abnormal posture: Secondary | ICD-10-CM | POA: Diagnosis not present

## 2021-12-13 DIAGNOSIS — M542 Cervicalgia: Secondary | ICD-10-CM | POA: Diagnosis not present

## 2021-12-13 DIAGNOSIS — M25512 Pain in left shoulder: Secondary | ICD-10-CM | POA: Diagnosis not present

## 2021-12-13 DIAGNOSIS — R293 Abnormal posture: Secondary | ICD-10-CM | POA: Diagnosis not present

## 2021-12-15 DIAGNOSIS — Z85828 Personal history of other malignant neoplasm of skin: Secondary | ICD-10-CM | POA: Diagnosis not present

## 2021-12-15 DIAGNOSIS — L821 Other seborrheic keratosis: Secondary | ICD-10-CM | POA: Diagnosis not present

## 2021-12-15 DIAGNOSIS — D1801 Hemangioma of skin and subcutaneous tissue: Secondary | ICD-10-CM | POA: Diagnosis not present

## 2021-12-15 DIAGNOSIS — L570 Actinic keratosis: Secondary | ICD-10-CM | POA: Diagnosis not present

## 2021-12-16 DIAGNOSIS — M542 Cervicalgia: Secondary | ICD-10-CM | POA: Diagnosis not present

## 2021-12-16 DIAGNOSIS — R293 Abnormal posture: Secondary | ICD-10-CM | POA: Diagnosis not present

## 2021-12-16 DIAGNOSIS — M25512 Pain in left shoulder: Secondary | ICD-10-CM | POA: Diagnosis not present

## 2021-12-21 DIAGNOSIS — M542 Cervicalgia: Secondary | ICD-10-CM | POA: Diagnosis not present

## 2021-12-21 DIAGNOSIS — R293 Abnormal posture: Secondary | ICD-10-CM | POA: Diagnosis not present

## 2021-12-21 DIAGNOSIS — M25512 Pain in left shoulder: Secondary | ICD-10-CM | POA: Diagnosis not present

## 2021-12-23 DIAGNOSIS — M542 Cervicalgia: Secondary | ICD-10-CM | POA: Diagnosis not present

## 2021-12-23 DIAGNOSIS — M25512 Pain in left shoulder: Secondary | ICD-10-CM | POA: Diagnosis not present

## 2021-12-23 DIAGNOSIS — R293 Abnormal posture: Secondary | ICD-10-CM | POA: Diagnosis not present

## 2021-12-28 DIAGNOSIS — M542 Cervicalgia: Secondary | ICD-10-CM | POA: Diagnosis not present

## 2021-12-28 DIAGNOSIS — M25512 Pain in left shoulder: Secondary | ICD-10-CM | POA: Diagnosis not present

## 2021-12-28 DIAGNOSIS — R293 Abnormal posture: Secondary | ICD-10-CM | POA: Diagnosis not present

## 2021-12-30 DIAGNOSIS — M25512 Pain in left shoulder: Secondary | ICD-10-CM | POA: Diagnosis not present

## 2021-12-30 DIAGNOSIS — R293 Abnormal posture: Secondary | ICD-10-CM | POA: Diagnosis not present

## 2021-12-30 DIAGNOSIS — M542 Cervicalgia: Secondary | ICD-10-CM | POA: Diagnosis not present

## 2022-01-24 DIAGNOSIS — K4020 Bilateral inguinal hernia, without obstruction or gangrene, not specified as recurrent: Secondary | ICD-10-CM | POA: Diagnosis not present

## 2022-01-26 DIAGNOSIS — M25512 Pain in left shoulder: Secondary | ICD-10-CM | POA: Diagnosis not present

## 2022-01-26 DIAGNOSIS — R293 Abnormal posture: Secondary | ICD-10-CM | POA: Diagnosis not present

## 2022-01-26 DIAGNOSIS — M542 Cervicalgia: Secondary | ICD-10-CM | POA: Diagnosis not present

## 2022-03-30 DIAGNOSIS — J019 Acute sinusitis, unspecified: Secondary | ICD-10-CM | POA: Diagnosis not present

## 2022-04-22 DIAGNOSIS — K4020 Bilateral inguinal hernia, without obstruction or gangrene, not specified as recurrent: Secondary | ICD-10-CM | POA: Diagnosis not present

## 2022-07-18 DIAGNOSIS — M1711 Unilateral primary osteoarthritis, right knee: Secondary | ICD-10-CM | POA: Diagnosis not present

## 2022-07-30 DIAGNOSIS — S27321A Contusion of lung, unilateral, initial encounter: Secondary | ICD-10-CM | POA: Diagnosis not present

## 2022-07-30 DIAGNOSIS — N400 Enlarged prostate without lower urinary tract symptoms: Secondary | ICD-10-CM | POA: Diagnosis not present

## 2022-07-30 DIAGNOSIS — S22009A Unspecified fracture of unspecified thoracic vertebra, initial encounter for closed fracture: Secondary | ICD-10-CM | POA: Diagnosis not present

## 2022-07-30 DIAGNOSIS — M47812 Spondylosis without myelopathy or radiculopathy, cervical region: Secondary | ICD-10-CM | POA: Diagnosis not present

## 2022-07-30 DIAGNOSIS — I700 Atherosclerosis of aorta: Secondary | ICD-10-CM | POA: Diagnosis not present

## 2022-07-30 DIAGNOSIS — M5030 Other cervical disc degeneration, unspecified cervical region: Secondary | ICD-10-CM | POA: Diagnosis not present

## 2022-07-30 DIAGNOSIS — S2242XA Multiple fractures of ribs, left side, initial encounter for closed fracture: Secondary | ICD-10-CM | POA: Diagnosis not present

## 2022-07-30 DIAGNOSIS — N189 Chronic kidney disease, unspecified: Secondary | ICD-10-CM | POA: Diagnosis not present

## 2022-07-30 DIAGNOSIS — M5021 Other cervical disc displacement,  high cervical region: Secondary | ICD-10-CM | POA: Diagnosis not present

## 2022-07-30 DIAGNOSIS — S22070A Wedge compression fracture of T9-T10 vertebra, initial encounter for closed fracture: Secondary | ICD-10-CM | POA: Diagnosis not present

## 2022-07-30 DIAGNOSIS — S0990XA Unspecified injury of head, initial encounter: Secondary | ICD-10-CM | POA: Diagnosis not present

## 2022-07-30 DIAGNOSIS — S199XXA Unspecified injury of neck, initial encounter: Secondary | ICD-10-CM | POA: Diagnosis not present

## 2022-07-30 DIAGNOSIS — R932 Abnormal findings on diagnostic imaging of liver and biliary tract: Secondary | ICD-10-CM | POA: Diagnosis not present

## 2022-07-31 DIAGNOSIS — S22070A Wedge compression fracture of T9-T10 vertebra, initial encounter for closed fracture: Secondary | ICD-10-CM | POA: Diagnosis not present

## 2022-07-31 DIAGNOSIS — N189 Chronic kidney disease, unspecified: Secondary | ICD-10-CM | POA: Diagnosis not present

## 2022-07-31 DIAGNOSIS — S2242XA Multiple fractures of ribs, left side, initial encounter for closed fracture: Secondary | ICD-10-CM | POA: Diagnosis not present

## 2022-08-01 DIAGNOSIS — S22070A Wedge compression fracture of T9-T10 vertebra, initial encounter for closed fracture: Secondary | ICD-10-CM | POA: Diagnosis not present

## 2022-08-01 DIAGNOSIS — S2242XA Multiple fractures of ribs, left side, initial encounter for closed fracture: Secondary | ICD-10-CM | POA: Diagnosis not present

## 2022-08-01 DIAGNOSIS — N189 Chronic kidney disease, unspecified: Secondary | ICD-10-CM | POA: Diagnosis not present

## 2022-08-02 DIAGNOSIS — N189 Chronic kidney disease, unspecified: Secondary | ICD-10-CM | POA: Diagnosis not present

## 2022-08-02 DIAGNOSIS — S2242XA Multiple fractures of ribs, left side, initial encounter for closed fracture: Secondary | ICD-10-CM | POA: Diagnosis not present

## 2022-08-02 DIAGNOSIS — S22070A Wedge compression fracture of T9-T10 vertebra, initial encounter for closed fracture: Secondary | ICD-10-CM | POA: Diagnosis not present

## 2022-08-09 ENCOUNTER — Other Ambulatory Visit: Payer: Self-pay | Admitting: Cardiology

## 2022-08-09 DIAGNOSIS — M7918 Myalgia, other site: Secondary | ICD-10-CM | POA: Diagnosis not present

## 2022-08-09 DIAGNOSIS — E7800 Pure hypercholesterolemia, unspecified: Secondary | ICD-10-CM

## 2022-08-15 DIAGNOSIS — H52223 Regular astigmatism, bilateral: Secondary | ICD-10-CM | POA: Diagnosis not present

## 2022-08-15 DIAGNOSIS — Z23 Encounter for immunization: Secondary | ICD-10-CM | POA: Diagnosis not present

## 2022-08-15 DIAGNOSIS — H524 Presbyopia: Secondary | ICD-10-CM | POA: Diagnosis not present

## 2022-08-15 DIAGNOSIS — Z961 Presence of intraocular lens: Secondary | ICD-10-CM | POA: Diagnosis not present

## 2022-08-15 DIAGNOSIS — H43812 Vitreous degeneration, left eye: Secondary | ICD-10-CM | POA: Diagnosis not present

## 2022-09-12 DIAGNOSIS — L820 Inflamed seborrheic keratosis: Secondary | ICD-10-CM | POA: Diagnosis not present

## 2022-09-12 DIAGNOSIS — D485 Neoplasm of uncertain behavior of skin: Secondary | ICD-10-CM | POA: Diagnosis not present

## 2022-09-27 DIAGNOSIS — H6122 Impacted cerumen, left ear: Secondary | ICD-10-CM | POA: Diagnosis not present

## 2022-12-01 DIAGNOSIS — H903 Sensorineural hearing loss, bilateral: Secondary | ICD-10-CM | POA: Diagnosis not present

## 2022-12-19 DIAGNOSIS — L570 Actinic keratosis: Secondary | ICD-10-CM | POA: Diagnosis not present

## 2022-12-19 DIAGNOSIS — Z85828 Personal history of other malignant neoplasm of skin: Secondary | ICD-10-CM | POA: Diagnosis not present

## 2022-12-19 DIAGNOSIS — D1801 Hemangioma of skin and subcutaneous tissue: Secondary | ICD-10-CM | POA: Diagnosis not present

## 2022-12-19 DIAGNOSIS — L821 Other seborrheic keratosis: Secondary | ICD-10-CM | POA: Diagnosis not present

## 2023-03-27 DIAGNOSIS — H04123 Dry eye syndrome of bilateral lacrimal glands: Secondary | ICD-10-CM | POA: Diagnosis not present

## 2023-03-27 DIAGNOSIS — H43811 Vitreous degeneration, right eye: Secondary | ICD-10-CM | POA: Diagnosis not present

## 2023-03-27 DIAGNOSIS — H5213 Myopia, bilateral: Secondary | ICD-10-CM | POA: Diagnosis not present

## 2023-03-27 DIAGNOSIS — H2513 Age-related nuclear cataract, bilateral: Secondary | ICD-10-CM | POA: Diagnosis not present

## 2023-06-12 DIAGNOSIS — L820 Inflamed seborrheic keratosis: Secondary | ICD-10-CM | POA: Diagnosis not present

## 2023-06-12 DIAGNOSIS — L821 Other seborrheic keratosis: Secondary | ICD-10-CM | POA: Diagnosis not present

## 2023-07-14 DIAGNOSIS — M2550 Pain in unspecified joint: Secondary | ICD-10-CM | POA: Diagnosis not present

## 2023-07-14 DIAGNOSIS — Z79899 Other long term (current) drug therapy: Secondary | ICD-10-CM | POA: Diagnosis not present

## 2023-07-14 DIAGNOSIS — N189 Chronic kidney disease, unspecified: Secondary | ICD-10-CM | POA: Diagnosis not present

## 2023-07-14 DIAGNOSIS — E039 Hypothyroidism, unspecified: Secondary | ICD-10-CM | POA: Diagnosis not present

## 2023-07-17 ENCOUNTER — Other Ambulatory Visit: Payer: Self-pay | Admitting: Family Medicine

## 2023-07-17 ENCOUNTER — Ambulatory Visit
Admission: RE | Admit: 2023-07-17 | Discharge: 2023-07-17 | Disposition: A | Discharge status: Home / Self Care | Payer: Medicare Other | Source: Ambulatory Visit | Attending: Family Medicine | Admitting: Family Medicine

## 2023-07-17 DIAGNOSIS — M19071 Primary osteoarthritis, right ankle and foot: Secondary | ICD-10-CM | POA: Diagnosis not present

## 2023-07-17 DIAGNOSIS — R059 Cough, unspecified: Secondary | ICD-10-CM

## 2023-07-17 DIAGNOSIS — R058 Other specified cough: Secondary | ICD-10-CM | POA: Diagnosis not present

## 2023-07-17 DIAGNOSIS — M79674 Pain in right toe(s): Secondary | ICD-10-CM

## 2023-07-17 DIAGNOSIS — D649 Anemia, unspecified: Secondary | ICD-10-CM | POA: Diagnosis not present

## 2023-07-17 DIAGNOSIS — M19072 Primary osteoarthritis, left ankle and foot: Secondary | ICD-10-CM | POA: Diagnosis not present

## 2023-07-17 DIAGNOSIS — M79675 Pain in left toe(s): Secondary | ICD-10-CM | POA: Diagnosis not present

## 2023-07-27 DIAGNOSIS — D649 Anemia, unspecified: Secondary | ICD-10-CM | POA: Diagnosis not present

## 2023-07-28 ENCOUNTER — Other Ambulatory Visit: Payer: Self-pay | Admitting: Family Medicine

## 2023-07-28 DIAGNOSIS — J439 Emphysema, unspecified: Secondary | ICD-10-CM

## 2023-08-01 DIAGNOSIS — Z23 Encounter for immunization: Secondary | ICD-10-CM | POA: Diagnosis not present

## 2023-08-04 ENCOUNTER — Other Ambulatory Visit: Payer: Self-pay | Admitting: Cardiology

## 2023-08-04 DIAGNOSIS — M130 Polyarthritis, unspecified: Secondary | ICD-10-CM | POA: Diagnosis not present

## 2023-08-04 DIAGNOSIS — H9193 Unspecified hearing loss, bilateral: Secondary | ICD-10-CM | POA: Diagnosis not present

## 2023-08-04 DIAGNOSIS — E039 Hypothyroidism, unspecified: Secondary | ICD-10-CM | POA: Diagnosis not present

## 2023-08-04 DIAGNOSIS — Z0000 Encounter for general adult medical examination without abnormal findings: Secondary | ICD-10-CM | POA: Diagnosis not present

## 2023-08-04 DIAGNOSIS — E7800 Pure hypercholesterolemia, unspecified: Secondary | ICD-10-CM

## 2023-08-04 DIAGNOSIS — N289 Disorder of kidney and ureter, unspecified: Secondary | ICD-10-CM | POA: Diagnosis not present

## 2023-08-04 DIAGNOSIS — E782 Mixed hyperlipidemia: Secondary | ICD-10-CM | POA: Diagnosis not present

## 2023-08-04 DIAGNOSIS — D509 Iron deficiency anemia, unspecified: Secondary | ICD-10-CM | POA: Diagnosis not present

## 2023-08-04 DIAGNOSIS — J439 Emphysema, unspecified: Secondary | ICD-10-CM | POA: Diagnosis not present

## 2023-08-08 ENCOUNTER — Ambulatory Visit
Admission: RE | Admit: 2023-08-08 | Discharge: 2023-08-08 | Disposition: A | Discharge status: Home / Self Care | Payer: Medicare Other | Source: Ambulatory Visit | Attending: Family Medicine | Admitting: Family Medicine

## 2023-08-08 DIAGNOSIS — J439 Emphysema, unspecified: Secondary | ICD-10-CM

## 2023-08-08 DIAGNOSIS — R918 Other nonspecific abnormal finding of lung field: Secondary | ICD-10-CM | POA: Diagnosis not present

## 2023-08-08 DIAGNOSIS — I700 Atherosclerosis of aorta: Secondary | ICD-10-CM | POA: Diagnosis not present

## 2023-08-21 DIAGNOSIS — M79672 Pain in left foot: Secondary | ICD-10-CM | POA: Diagnosis not present

## 2023-08-21 DIAGNOSIS — M79671 Pain in right foot: Secondary | ICD-10-CM | POA: Diagnosis not present

## 2023-09-11 DIAGNOSIS — E782 Mixed hyperlipidemia: Secondary | ICD-10-CM | POA: Diagnosis not present

## 2023-09-11 DIAGNOSIS — D509 Iron deficiency anemia, unspecified: Secondary | ICD-10-CM | POA: Diagnosis not present

## 2023-09-11 DIAGNOSIS — N289 Disorder of kidney and ureter, unspecified: Secondary | ICD-10-CM | POA: Diagnosis not present

## 2023-09-11 DIAGNOSIS — R946 Abnormal results of thyroid function studies: Secondary | ICD-10-CM | POA: Diagnosis not present

## 2023-09-15 DIAGNOSIS — M2022 Hallux rigidus, left foot: Secondary | ICD-10-CM | POA: Diagnosis not present

## 2023-09-15 DIAGNOSIS — M2021 Hallux rigidus, right foot: Secondary | ICD-10-CM | POA: Diagnosis not present

## 2023-09-25 IMAGING — CR DG CERVICAL SPINE COMPLETE 4+V
6 series · 6 of 6 positions shown · non-contrast
Comparison: None.

CLINICAL DATA: Neck pain, radicular pain

EXAM:
CERVICAL SPINE - COMPLETE 4+ VIEW

[w cervical spine lat]
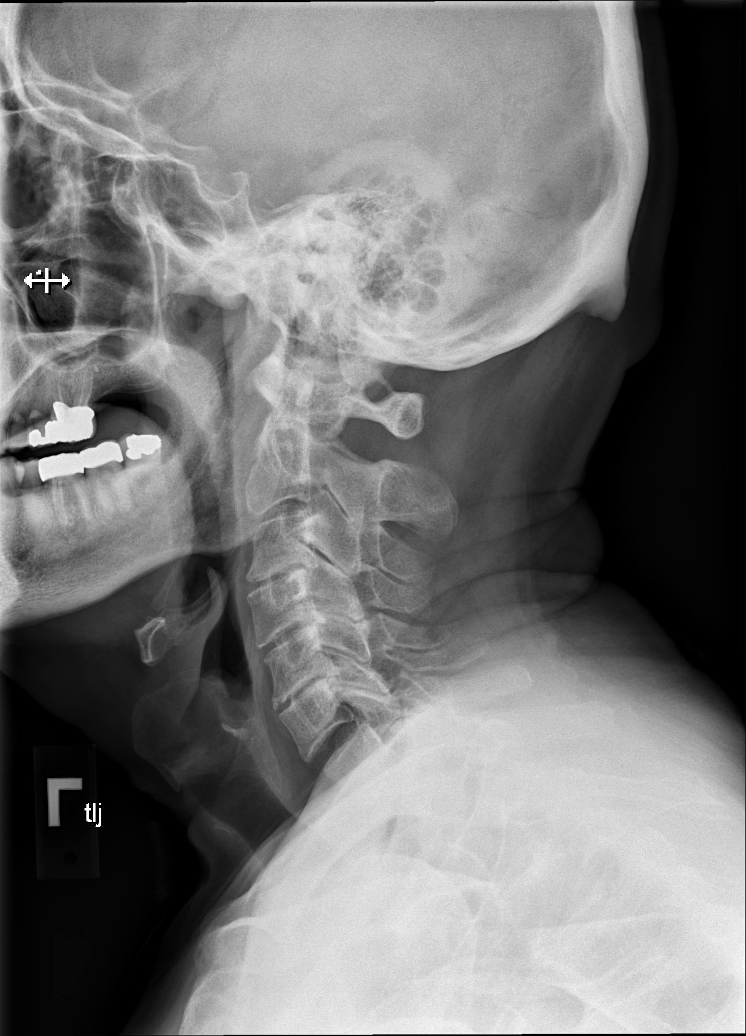

[w cervical spine ap_obl (1 of 2)]
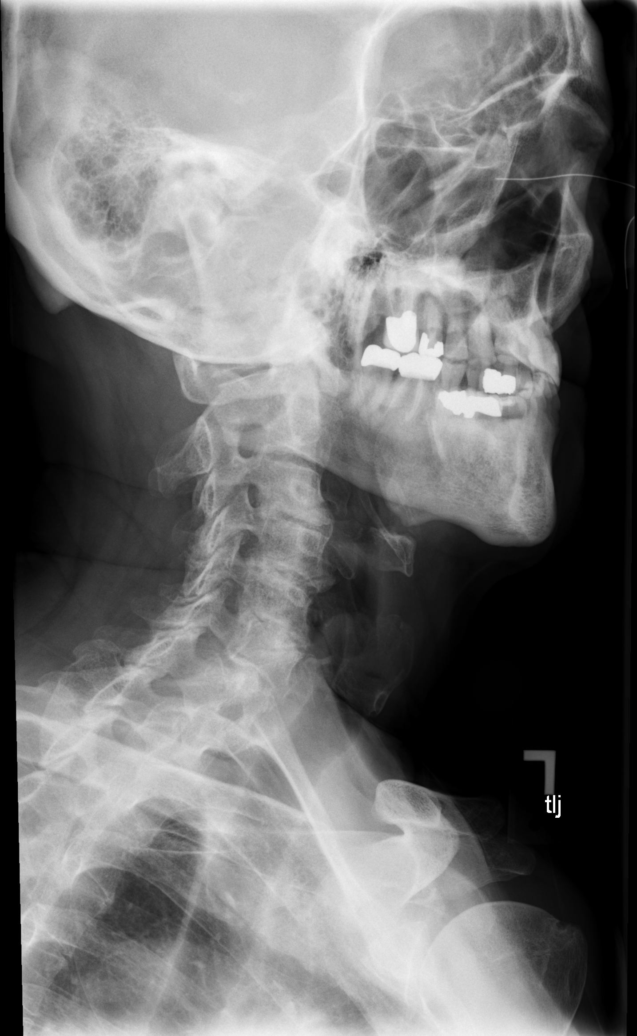

[w cervical spine ap_obl (2 of 2)]
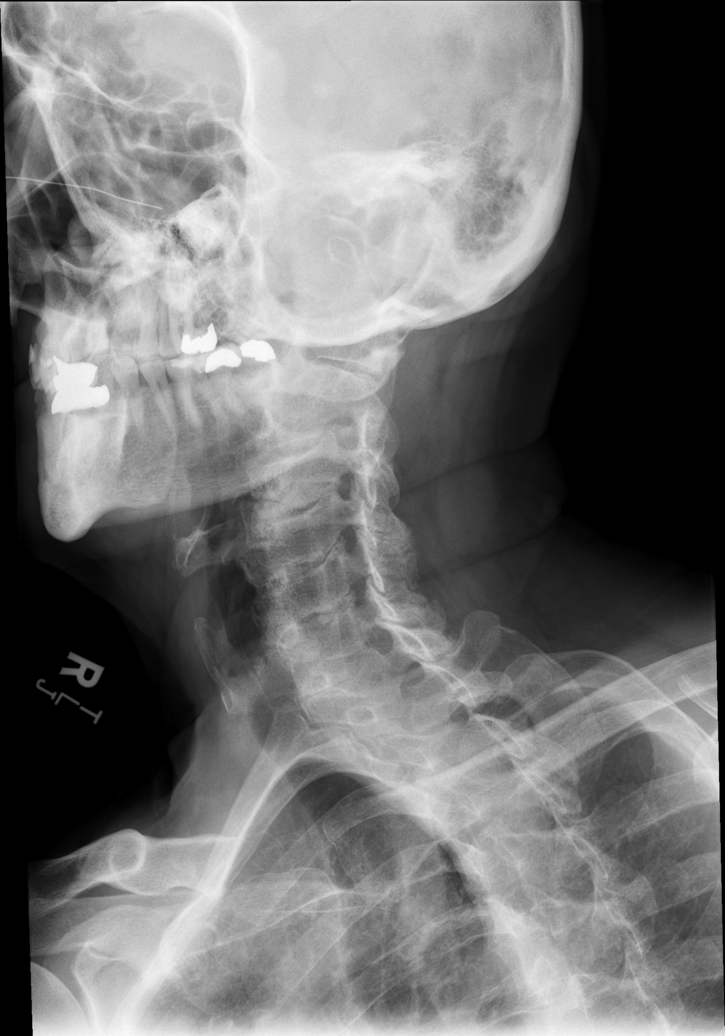

[w cervical spine ap]
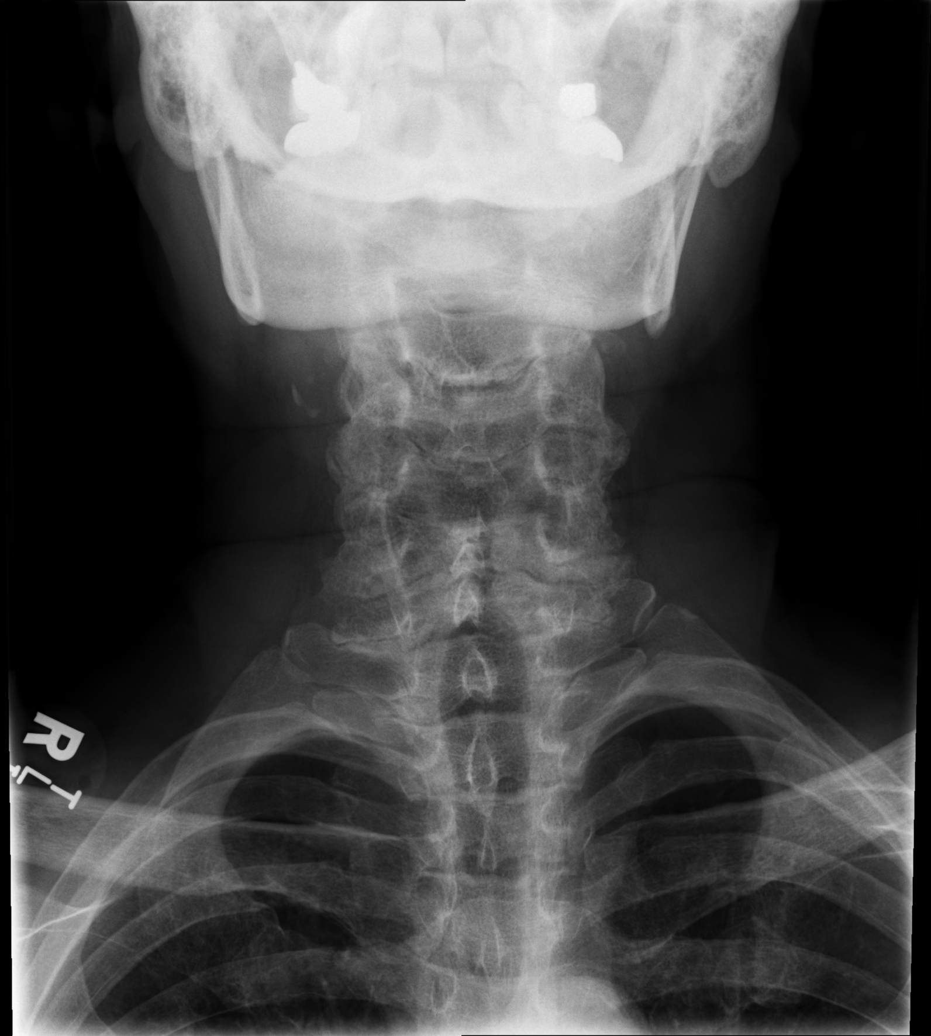

[w cervical spine odontoid (1 of 2)]
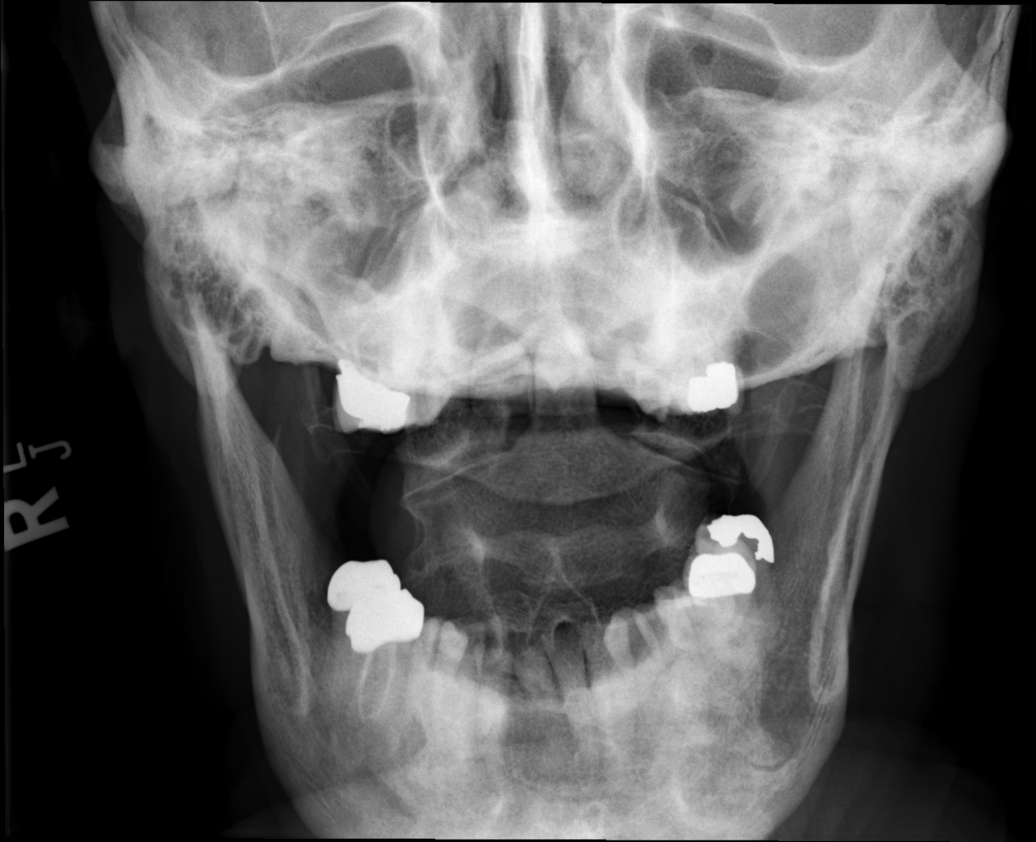

[w cervical spine odontoid (2 of 2)]
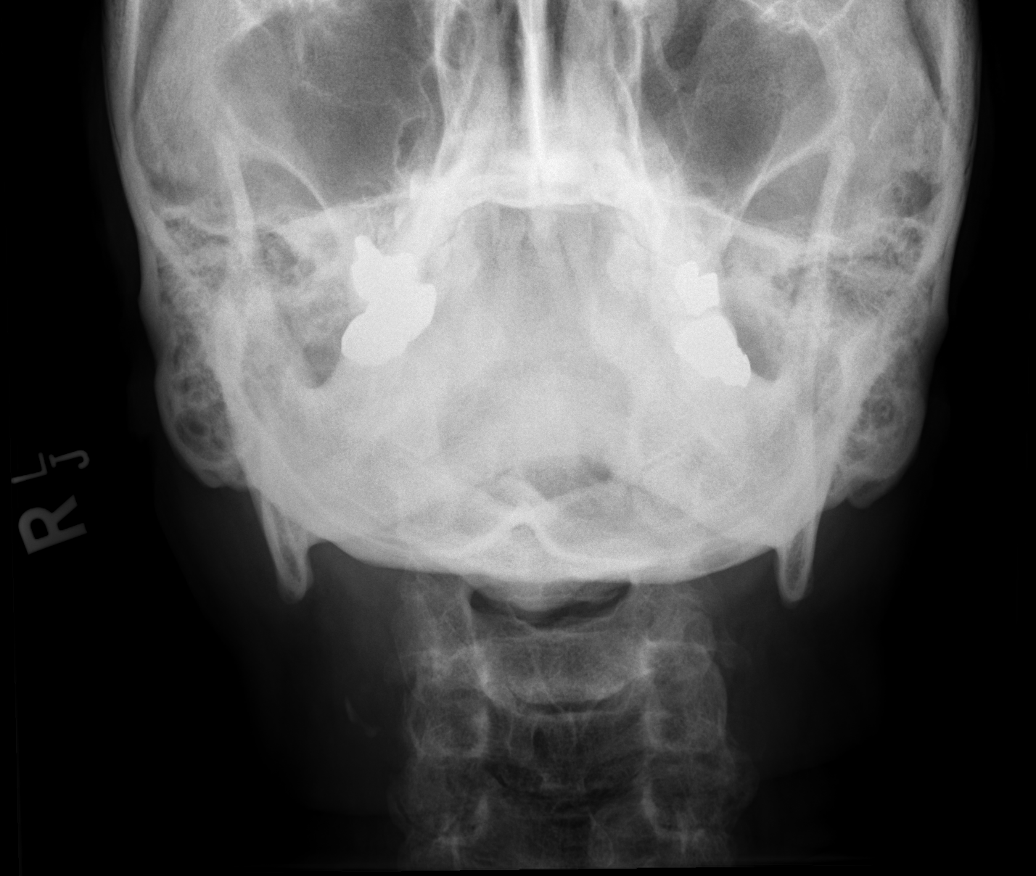

[6 of 6 positions shown; findings below may reference images not displayed]

FINDINGS: No recent fracture is seen. There is minimal retrolisthesis at C3-C4
level. Degenerative changes are noted with disc space narrowing and
bony spurs from C3 to C6 levels. There is encroachment of neural
foramina from C3-C6 levels. Prevertebral soft tissues are
unremarkable.
IMPRESSION: No recent fracture is seen in the cervical spine. Cervical
spondylosis with encroachment of neural foramina from C3-C6 levels.

## 2023-12-25 DIAGNOSIS — D235 Other benign neoplasm of skin of trunk: Secondary | ICD-10-CM | POA: Diagnosis not present

## 2023-12-25 DIAGNOSIS — Z129 Encounter for screening for malignant neoplasm, site unspecified: Secondary | ICD-10-CM | POA: Diagnosis not present

## 2023-12-25 DIAGNOSIS — W908XXS Exposure to other nonionizing radiation, sequela: Secondary | ICD-10-CM | POA: Diagnosis not present

## 2023-12-25 DIAGNOSIS — L578 Other skin changes due to chronic exposure to nonionizing radiation: Secondary | ICD-10-CM | POA: Diagnosis not present

## 2023-12-25 DIAGNOSIS — D1801 Hemangioma of skin and subcutaneous tissue: Secondary | ICD-10-CM | POA: Diagnosis not present

## 2023-12-25 DIAGNOSIS — Z85828 Personal history of other malignant neoplasm of skin: Secondary | ICD-10-CM | POA: Diagnosis not present

## 2024-02-29 DIAGNOSIS — Z961 Presence of intraocular lens: Secondary | ICD-10-CM | POA: Diagnosis not present

## 2024-02-29 DIAGNOSIS — H43812 Vitreous degeneration, left eye: Secondary | ICD-10-CM | POA: Diagnosis not present

## 2024-02-29 DIAGNOSIS — H524 Presbyopia: Secondary | ICD-10-CM | POA: Diagnosis not present

## 2024-04-30 DIAGNOSIS — H43811 Vitreous degeneration, right eye: Secondary | ICD-10-CM | POA: Diagnosis not present

## 2024-04-30 DIAGNOSIS — H5213 Myopia, bilateral: Secondary | ICD-10-CM | POA: Diagnosis not present

## 2024-04-30 DIAGNOSIS — H2513 Age-related nuclear cataract, bilateral: Secondary | ICD-10-CM | POA: Diagnosis not present

## 2024-04-30 DIAGNOSIS — H04123 Dry eye syndrome of bilateral lacrimal glands: Secondary | ICD-10-CM | POA: Diagnosis not present

## 2024-05-07 DIAGNOSIS — M79671 Pain in right foot: Secondary | ICD-10-CM | POA: Diagnosis not present

## 2024-05-07 DIAGNOSIS — Z6825 Body mass index (BMI) 25.0-25.9, adult: Secondary | ICD-10-CM | POA: Diagnosis not present

## 2024-05-07 DIAGNOSIS — M792 Neuralgia and neuritis, unspecified: Secondary | ICD-10-CM | POA: Diagnosis not present

## 2024-05-07 DIAGNOSIS — M79672 Pain in left foot: Secondary | ICD-10-CM | POA: Diagnosis not present

## 2024-05-07 DIAGNOSIS — E039 Hypothyroidism, unspecified: Secondary | ICD-10-CM | POA: Diagnosis not present

## 2024-05-21 DIAGNOSIS — M19071 Primary osteoarthritis, right ankle and foot: Secondary | ICD-10-CM | POA: Diagnosis not present

## 2024-05-21 DIAGNOSIS — M19072 Primary osteoarthritis, left ankle and foot: Secondary | ICD-10-CM | POA: Diagnosis not present

## 2024-05-21 DIAGNOSIS — Z6825 Body mass index (BMI) 25.0-25.9, adult: Secondary | ICD-10-CM | POA: Diagnosis not present

## 2024-09-03 DIAGNOSIS — M2021 Hallux rigidus, right foot: Secondary | ICD-10-CM | POA: Diagnosis not present

## 2024-09-03 DIAGNOSIS — M19072 Primary osteoarthritis, left ankle and foot: Secondary | ICD-10-CM | POA: Diagnosis not present

## 2024-09-03 DIAGNOSIS — M19071 Primary osteoarthritis, right ankle and foot: Secondary | ICD-10-CM | POA: Diagnosis not present

## 2024-09-03 DIAGNOSIS — I70203 Unspecified atherosclerosis of native arteries of extremities, bilateral legs: Secondary | ICD-10-CM | POA: Diagnosis not present

## 2024-09-03 DIAGNOSIS — L603 Nail dystrophy: Secondary | ICD-10-CM | POA: Diagnosis not present

## 2024-09-03 DIAGNOSIS — M2022 Hallux rigidus, left foot: Secondary | ICD-10-CM | POA: Diagnosis not present

## 2024-09-03 DIAGNOSIS — G629 Polyneuropathy, unspecified: Secondary | ICD-10-CM | POA: Diagnosis not present

## 2024-09-03 DIAGNOSIS — B351 Tinea unguium: Secondary | ICD-10-CM | POA: Diagnosis not present

## 2024-09-19 DIAGNOSIS — M792 Neuralgia and neuritis, unspecified: Secondary | ICD-10-CM | POA: Diagnosis not present

## 2024-09-19 DIAGNOSIS — G629 Polyneuropathy, unspecified: Secondary | ICD-10-CM | POA: Diagnosis not present

## 2024-09-19 DIAGNOSIS — N400 Enlarged prostate without lower urinary tract symptoms: Secondary | ICD-10-CM | POA: Diagnosis not present

## 2024-09-19 DIAGNOSIS — N289 Disorder of kidney and ureter, unspecified: Secondary | ICD-10-CM | POA: Diagnosis not present

## 2024-09-19 DIAGNOSIS — Z79899 Other long term (current) drug therapy: Secondary | ICD-10-CM | POA: Diagnosis not present

## 2024-09-19 DIAGNOSIS — E039 Hypothyroidism, unspecified: Secondary | ICD-10-CM | POA: Diagnosis not present

## 2024-09-19 DIAGNOSIS — E782 Mixed hyperlipidemia: Secondary | ICD-10-CM | POA: Diagnosis not present

## 2024-09-19 DIAGNOSIS — D509 Iron deficiency anemia, unspecified: Secondary | ICD-10-CM | POA: Diagnosis not present

## 2024-09-24 DIAGNOSIS — Z0000 Encounter for general adult medical examination without abnormal findings: Secondary | ICD-10-CM | POA: Diagnosis not present

## 2024-09-24 DIAGNOSIS — E538 Deficiency of other specified B group vitamins: Secondary | ICD-10-CM | POA: Diagnosis not present

## 2024-09-24 DIAGNOSIS — Z1331 Encounter for screening for depression: Secondary | ICD-10-CM | POA: Diagnosis not present

## 2024-09-24 DIAGNOSIS — N189 Chronic kidney disease, unspecified: Secondary | ICD-10-CM | POA: Diagnosis not present

## 2024-09-24 DIAGNOSIS — E559 Vitamin D deficiency, unspecified: Secondary | ICD-10-CM | POA: Diagnosis not present

## 2024-09-24 DIAGNOSIS — E782 Mixed hyperlipidemia: Secondary | ICD-10-CM | POA: Diagnosis not present

## 2024-09-24 DIAGNOSIS — M130 Polyarthritis, unspecified: Secondary | ICD-10-CM | POA: Diagnosis not present

## 2024-09-24 DIAGNOSIS — H9193 Unspecified hearing loss, bilateral: Secondary | ICD-10-CM | POA: Diagnosis not present

## 2024-09-24 DIAGNOSIS — G629 Polyneuropathy, unspecified: Secondary | ICD-10-CM | POA: Diagnosis not present

## 2024-09-24 DIAGNOSIS — E039 Hypothyroidism, unspecified: Secondary | ICD-10-CM | POA: Diagnosis not present

## 2024-09-24 DIAGNOSIS — E611 Iron deficiency: Secondary | ICD-10-CM | POA: Diagnosis not present

## 2024-09-24 DIAGNOSIS — I700 Atherosclerosis of aorta: Secondary | ICD-10-CM | POA: Diagnosis not present

## 2024-10-07 DIAGNOSIS — M19071 Primary osteoarthritis, right ankle and foot: Secondary | ICD-10-CM | POA: Diagnosis not present

## 2024-10-07 DIAGNOSIS — G629 Polyneuropathy, unspecified: Secondary | ICD-10-CM | POA: Diagnosis not present

## 2024-10-07 DIAGNOSIS — I70203 Unspecified atherosclerosis of native arteries of extremities, bilateral legs: Secondary | ICD-10-CM | POA: Diagnosis not present

## 2024-10-07 DIAGNOSIS — M2022 Hallux rigidus, left foot: Secondary | ICD-10-CM | POA: Diagnosis not present

## 2024-10-07 DIAGNOSIS — M19072 Primary osteoarthritis, left ankle and foot: Secondary | ICD-10-CM | POA: Diagnosis not present

## 2024-10-07 DIAGNOSIS — L603 Nail dystrophy: Secondary | ICD-10-CM | POA: Diagnosis not present

## 2024-10-07 DIAGNOSIS — B351 Tinea unguium: Secondary | ICD-10-CM | POA: Diagnosis not present

## 2024-10-07 DIAGNOSIS — M2021 Hallux rigidus, right foot: Secondary | ICD-10-CM | POA: Diagnosis not present
